# Patient Record
Sex: Female | Born: 1985 | Race: White | Hispanic: No | Marital: Married | Smoking: Current every day smoker
Health system: Southern US, Community
[De-identification: ages and names within clinical notes are randomized; demographics above are authoritative.]

## PROBLEM LIST (undated history)

## (undated) DIAGNOSIS — F32A Depression, unspecified: Secondary | ICD-10-CM

## (undated) DIAGNOSIS — Z789 Other specified health status: Secondary | ICD-10-CM

## (undated) DIAGNOSIS — F419 Anxiety disorder, unspecified: Secondary | ICD-10-CM

## (undated) DIAGNOSIS — F329 Major depressive disorder, single episode, unspecified: Secondary | ICD-10-CM

## (undated) HISTORY — PX: NO PAST SURGERIES: SHX2092

---
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
## 2006-03-26 ENCOUNTER — Ambulatory Visit (HOSPITAL_COMMUNITY): Admission: RE | Admit: 2006-03-26 | Discharge: 2006-03-26 | Payer: Self-pay | Admitting: Obstetrics & Gynecology

## 2006-04-20 IMAGING — US US OB FOLLOW-UP
1 series · 18 of 27 positions shown · non-contrast
Comparison: none

CLINICAL DATA: 39 week 5 day assigned gestational age.  Undergoing induction.  Evaluate fetal growth and estimated weight.  Large for gestational age.

[Series 1: us ob re-eval · 18 of 27 slices shown]
[im 1/27]
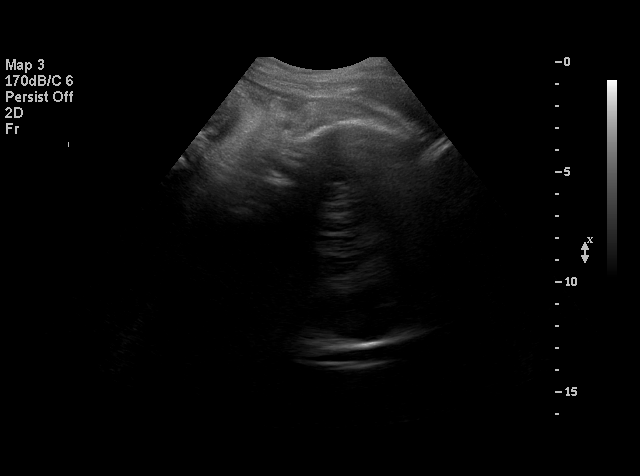
[im 3/27]
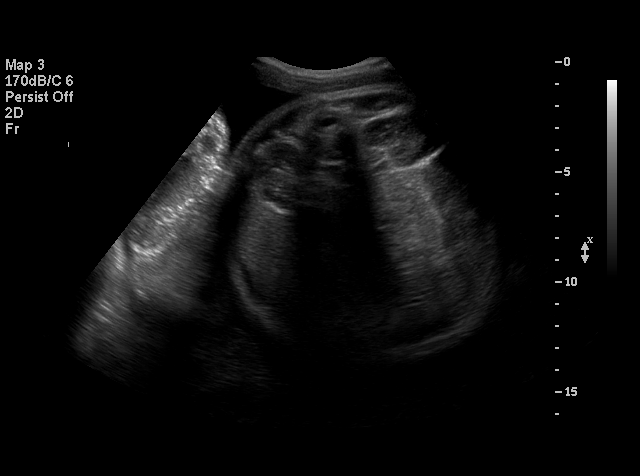
[im 4/27]
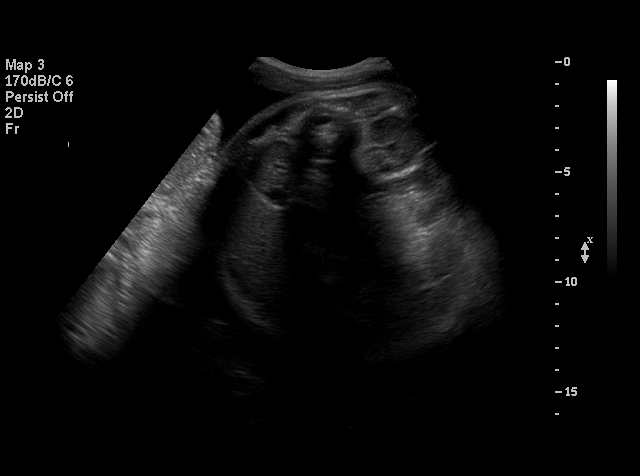
[im 6/27]
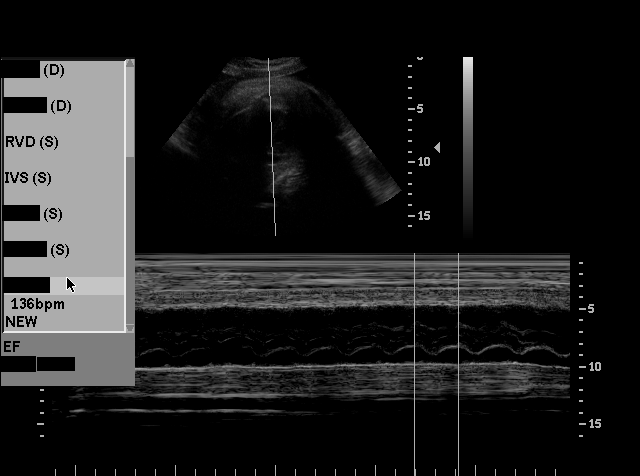
[im 7/27]
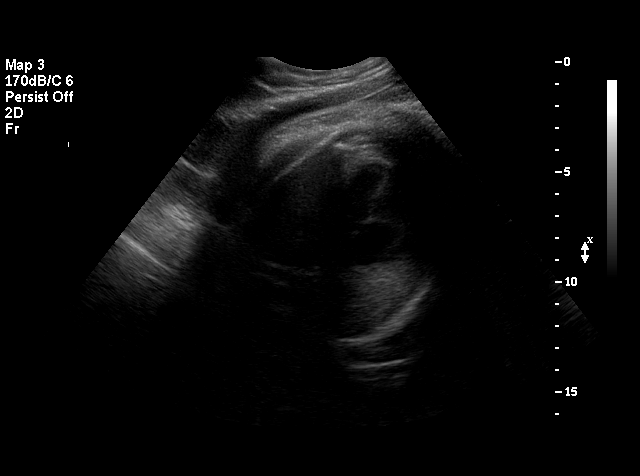
[im 9/27]
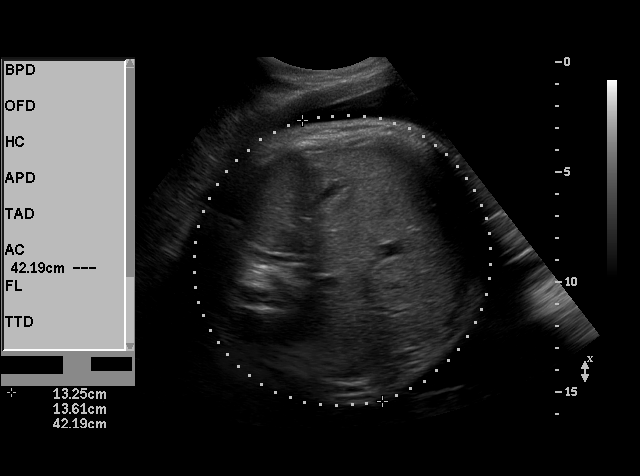
[im 10/27]
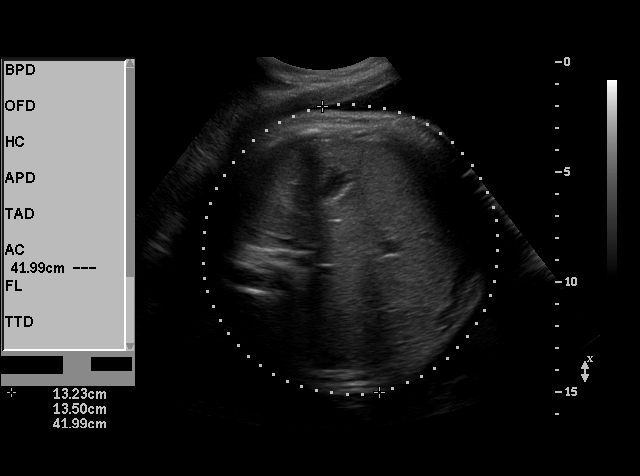
[im 12/27]
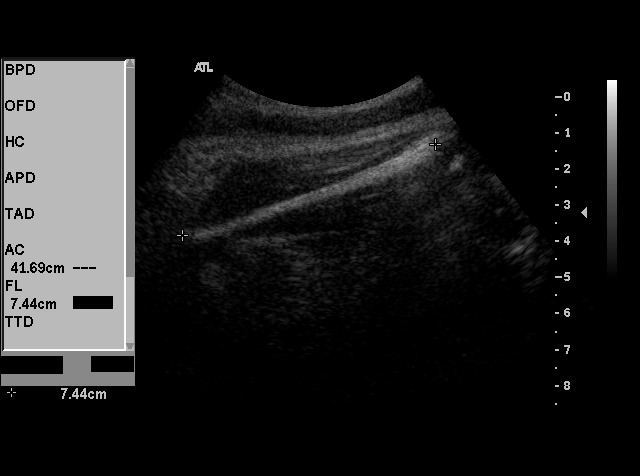
[im 13/27]
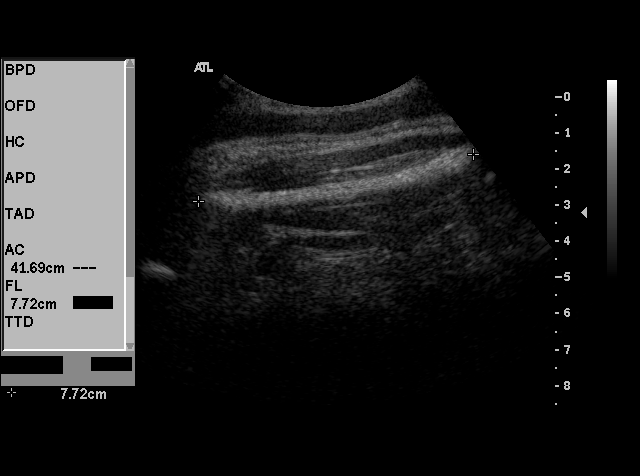
[im 15/27]
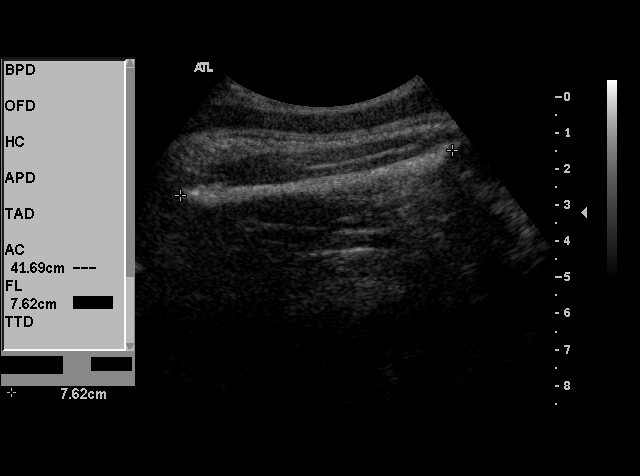
[im 16/27]
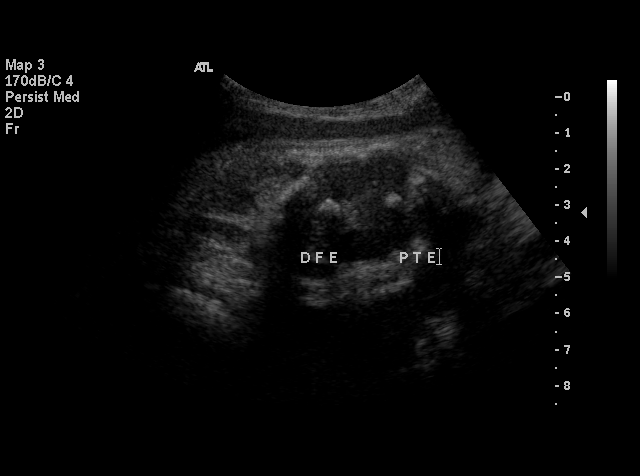
[im 18/27]
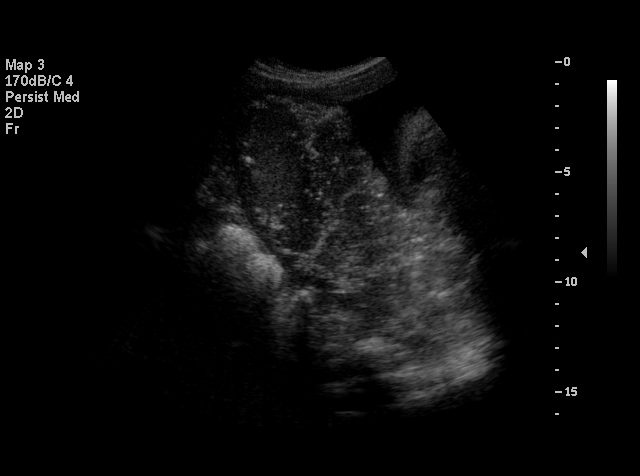
[im 19/27]
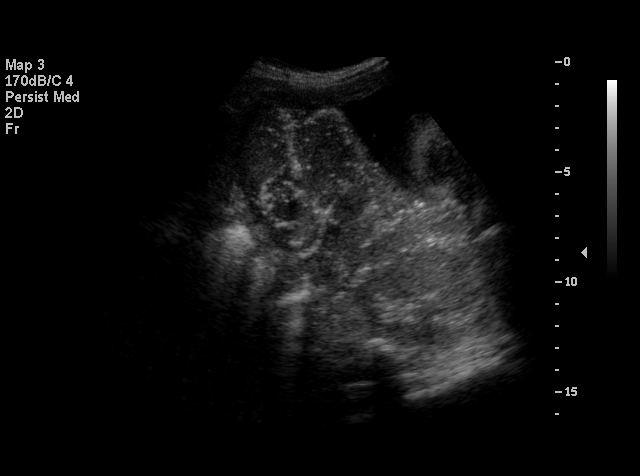
[im 21/27]
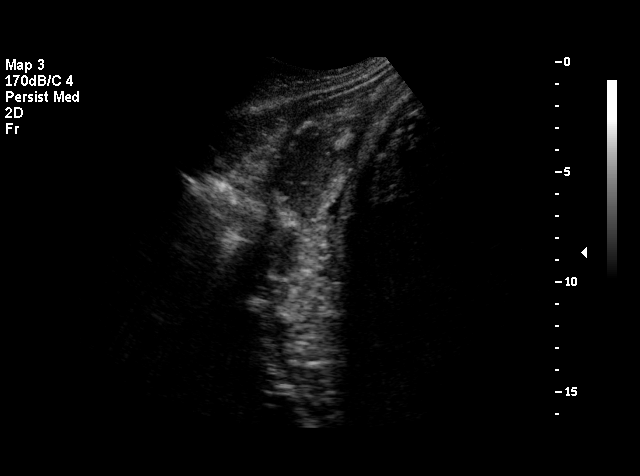
[im 22/27]
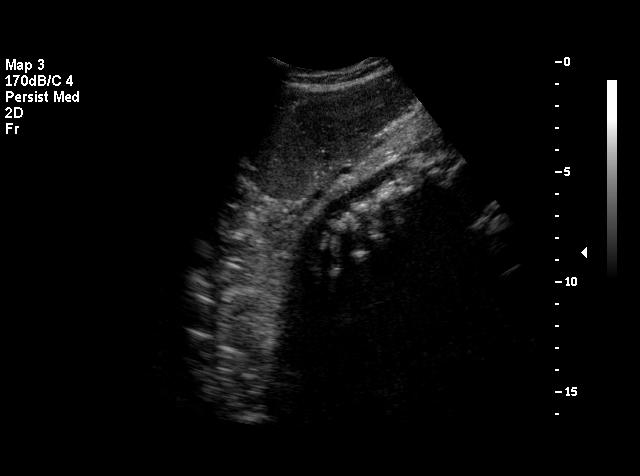
[im 24/27]
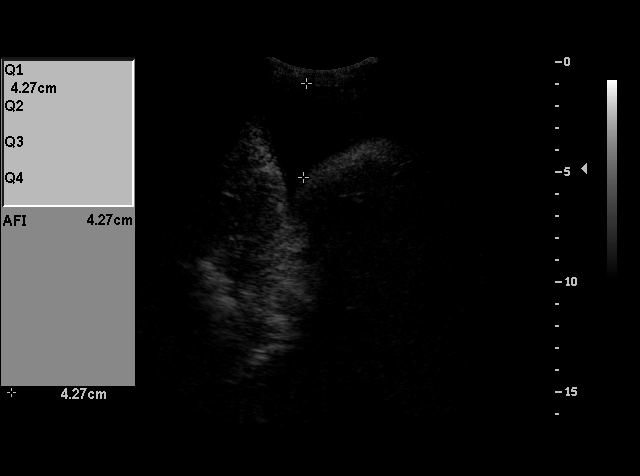
[im 25/27]
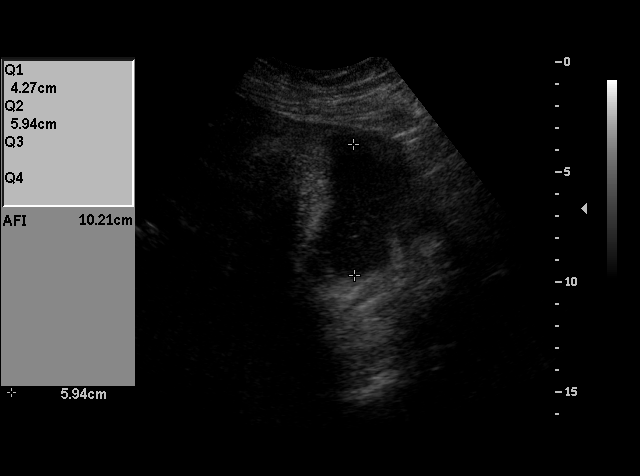
[im 27/27]
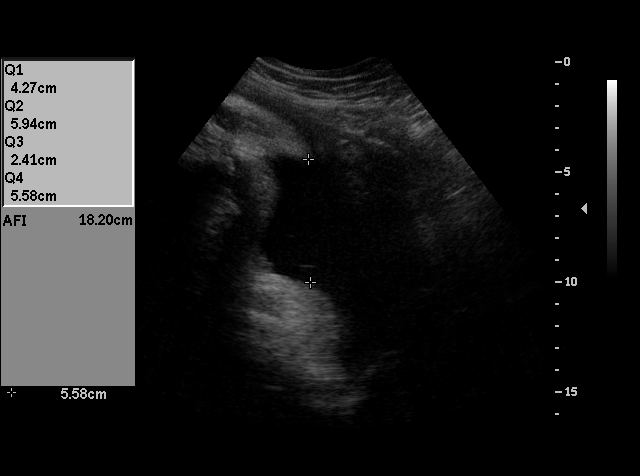

[18 of 27 positions shown; findings below may reference images not displayed]

OBSTETRICAL ULTRASOUND RE-EVALUATION:
Number of Fetuses: 1
Heart Rate:  136
Movement:  Yes
Breathing:  Yes
Presentation:  Cephalic
Placental Location:  Posterior
Grade:  III
Previa:  No
Amniotic Fluid (subjective):  Normal
Amniotic Fluid (objective):  18.2 cm AFI (5th -95th%ile = 7.1 ? 21.4 cm for 40 wks

FETAL BIOMETRY
BPD:    Unable to obtain due to low head position
HC:  Unable to obtain due to low head position
AC:  41.96 cm  42+ weeks
FL:  7.7 cm  39 w 3 d

Assigned GA:  39 w 5 d

Estimated fetal weight could not be calculated due to abdominal circumference being off the charts at greater than 42 weeks and inability to obtain head measurements due to fetal position.  

FETAL ANATOMY
Lateral Ventricles:  Previously seen 
Thalami/CSP:  Previously seen 
Posterior Fossa:  Previously seen 
Nuchal Region:  Previously seen 
Spine:  Previously seen 
4 Chamber Heart on Left:  Previously seen 
Stomach on Left:  Visualized 
3 Vessel Cord:  Previously seen  
Cord Insertion Site:  Previously seen 
Kidneys:  Visualized 
Bladder:  Visualized 
Extremities:  Previously seen 

MATERNAL UTERINE AND ADNEXAL FINDINGS
Cervix:  Not evaluated.
IMPRESSION: 1.  Assigned gestational age is currently 39 weeks 5 days.  Abdominal circumference is greater than two week ahead at >42 weeks (off the charts), and this is suspicious for fetal macrosomia.  
2.  Upper normal amniotic fluid volume with AFI of 18.2 cm.

## 2006-05-15 ENCOUNTER — Ambulatory Visit (HOSPITAL_COMMUNITY): Admission: RE | Admit: 2006-05-15 | Discharge: 2006-05-15 | Payer: Self-pay | Admitting: Obstetrics & Gynecology

## 2006-05-18 ENCOUNTER — Ambulatory Visit: Payer: Self-pay | Admitting: Gynecology

## 2006-07-15 ENCOUNTER — Emergency Department (HOSPITAL_COMMUNITY): Admission: EM | Admit: 2006-07-15 | Discharge: 2006-07-15 | Payer: Self-pay | Admitting: Emergency Medicine

## 2006-09-15 ENCOUNTER — Ambulatory Visit: Payer: Self-pay | Admitting: Obstetrics and Gynecology

## 2006-09-15 ENCOUNTER — Inpatient Hospital Stay (HOSPITAL_COMMUNITY): Admission: AD | Admit: 2006-09-15 | Discharge: 2006-09-15 | Payer: Self-pay | Admitting: Gynecology

## 2006-09-25 ENCOUNTER — Inpatient Hospital Stay (HOSPITAL_COMMUNITY): Admission: AD | Admit: 2006-09-25 | Discharge: 2006-09-25 | Payer: Self-pay | Admitting: Gynecology

## 2006-09-25 ENCOUNTER — Ambulatory Visit: Payer: Self-pay | Admitting: Obstetrics and Gynecology

## 2006-09-25 ENCOUNTER — Inpatient Hospital Stay (HOSPITAL_COMMUNITY): Admission: AD | Admit: 2006-09-25 | Discharge: 2006-09-26 | Payer: Self-pay | Admitting: Family Medicine

## 2006-10-08 ENCOUNTER — Inpatient Hospital Stay (HOSPITAL_COMMUNITY): Admission: AD | Admit: 2006-10-08 | Discharge: 2006-10-08 | Payer: Self-pay | Admitting: Obstetrics & Gynecology

## 2006-10-10 ENCOUNTER — Inpatient Hospital Stay (HOSPITAL_COMMUNITY): Admission: RE | Admit: 2006-10-10 | Discharge: 2006-10-12 | Payer: Self-pay | Admitting: Family Medicine

## 2006-10-10 ENCOUNTER — Ambulatory Visit: Payer: Self-pay | Admitting: Family Medicine

## 2007-08-24 IMAGING — US US OB COMP +14 WK
1 series · 13 of 28 positions shown · non-contrast
Comparison: none

CLINICAL DATA: Anatomy scan; assigned gestational age is 18 weeks 6 days.

[Series 1: us ob comp +14 wk · 0.37mm/px · 13 of 71 slices shown]
[im 3/71]
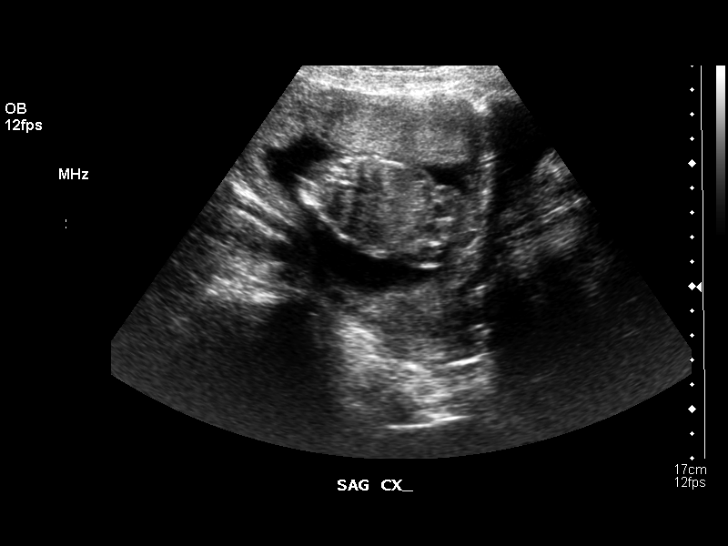
[im 8/71]
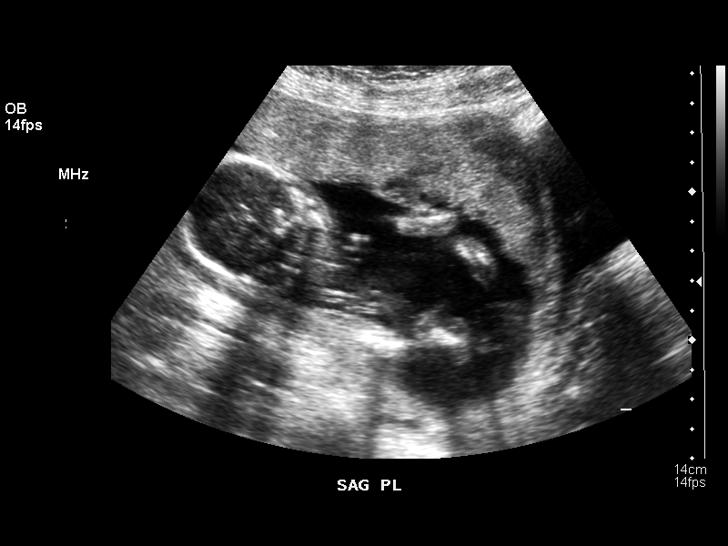
[im 13/71]
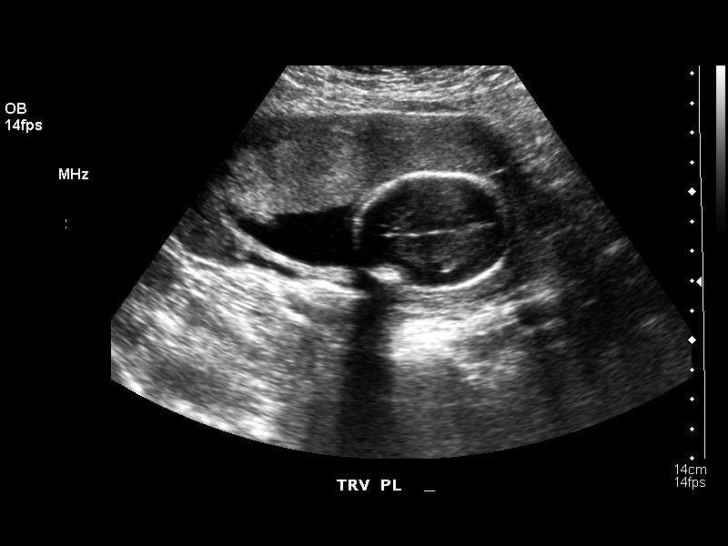
[im 19/71]
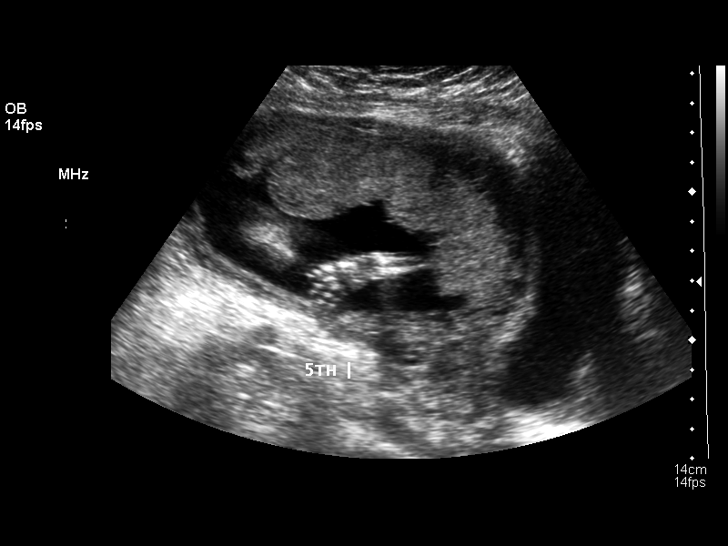
[im 24/71]
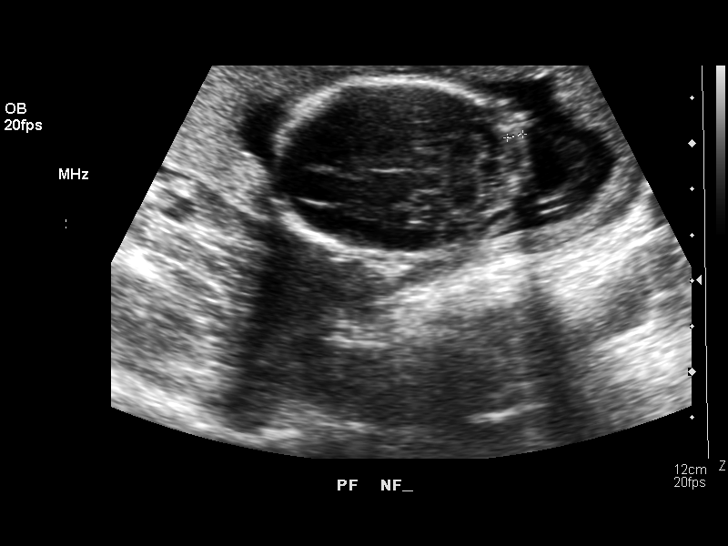
[im 29/71]
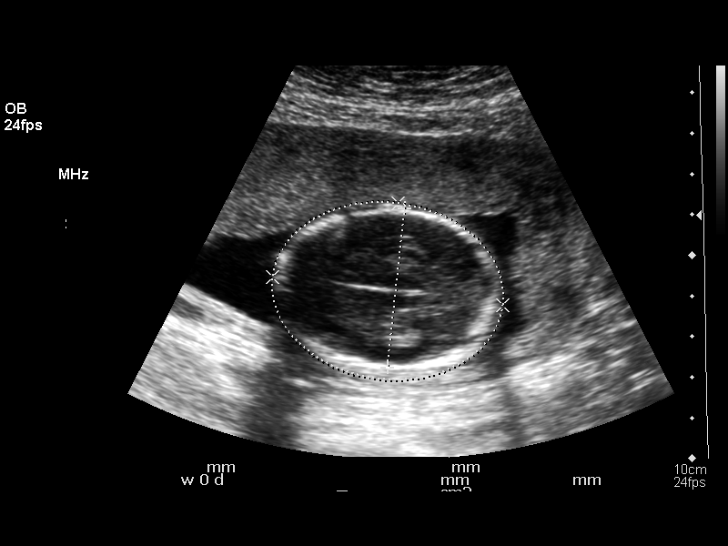
[im 37/71]
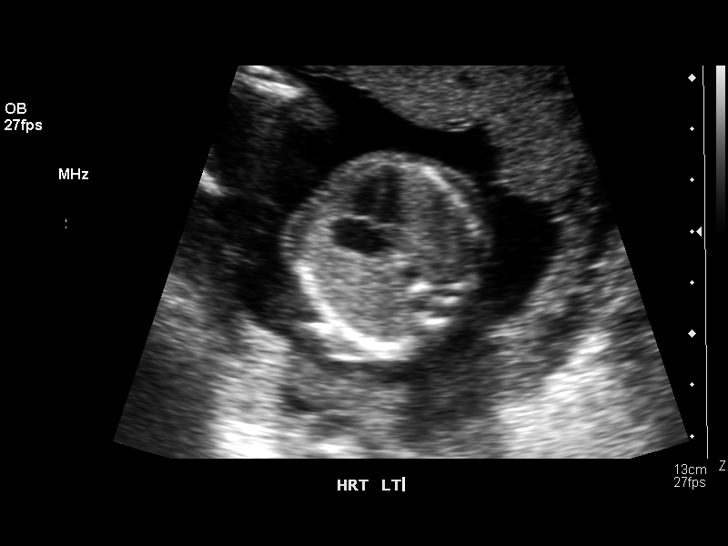
[im 42/71]
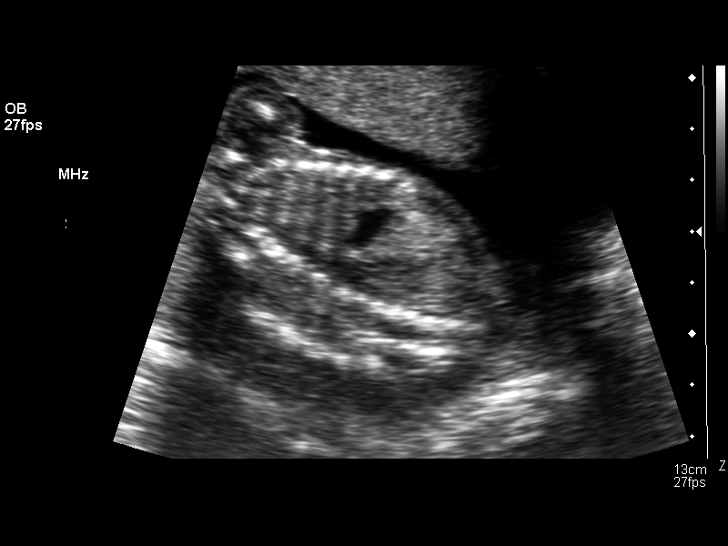
[im 47/71]
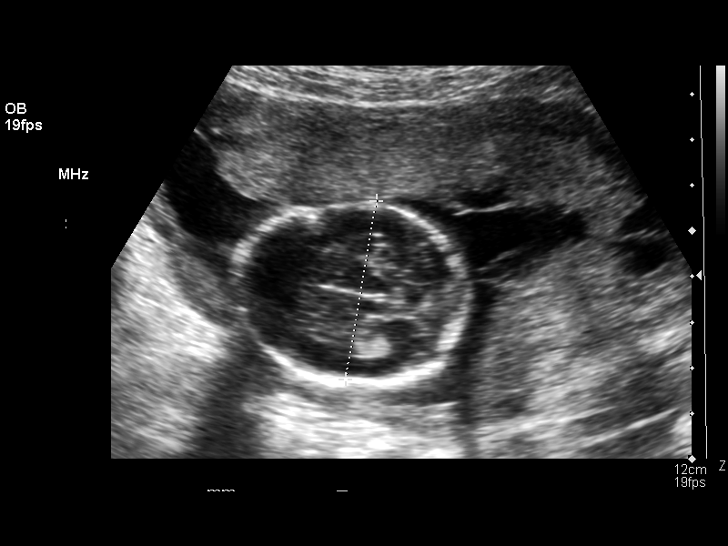
[im 52/71]
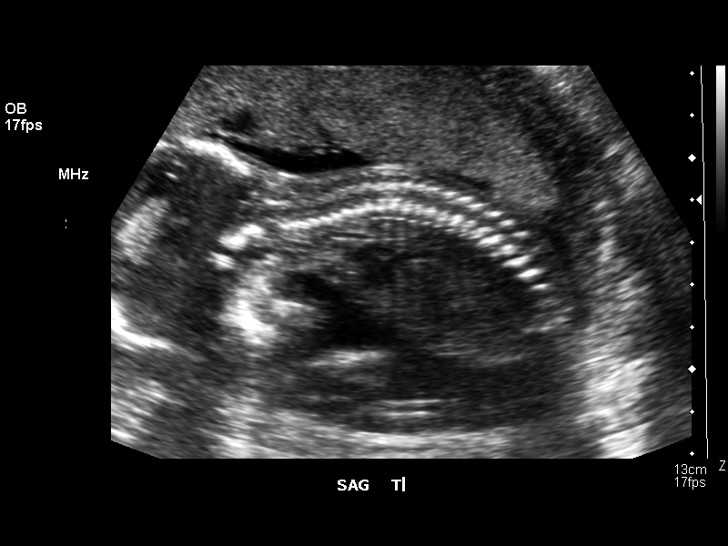
[im 58/71]
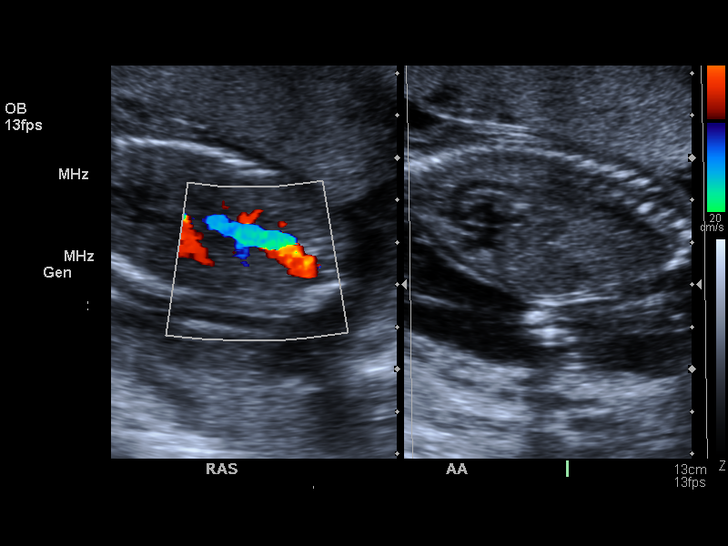
[im 63/71]
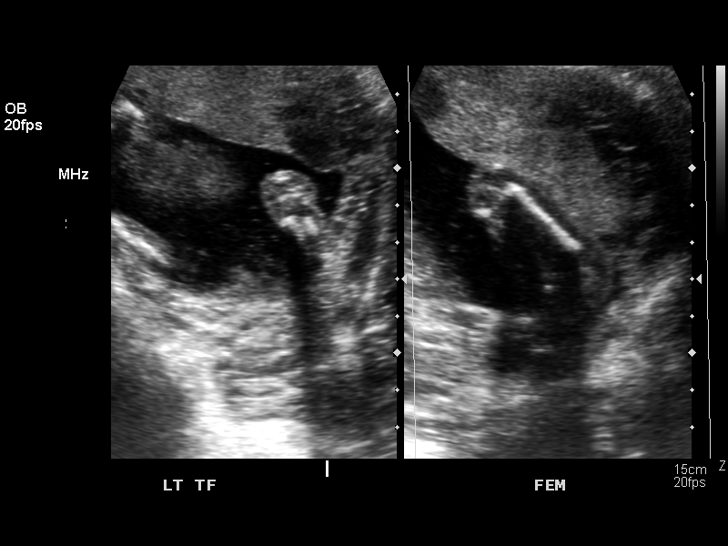
[im 68/71]
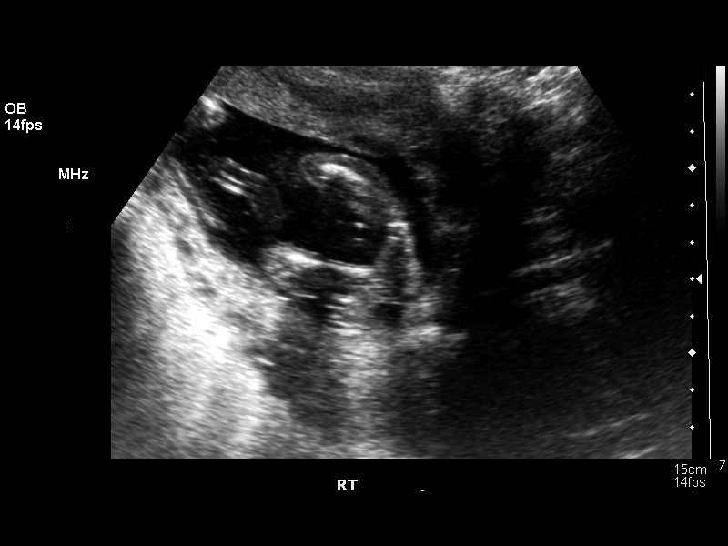

[13 of 28 positions shown; findings below may reference images not displayed]

OBSTETRICAL ULTRASOUND:
 Number of Fetuses:  1
 Heart Rate:  153 bpm
 Movement:  Yes
 Breathing:  No  
 Presentation:  Breech
 Placental Location:  Anterior
 Grade:  I
 Previa:  No
 Amniotic Fluid (Subjective):  Normal
 Amniotic Fluid (Objective):   4.0 cm vertical pocket 

 FETAL BIOMETRY
 BPD:   3.9 cm  18 w 0 d
 HC:   15.8 cm  18 w 5 d
 AC:  13.1 cm  18 w 4 d  
 FL:  2.8 cm  18 w 4 d

 MEAN GA:      18 w 3 d      US EDC:    10/13/06
 Assigned GA: 18 w 6 d   Assigned EDC:  10/10/06

 FETAL ANATOMY
 Lateral Ventricles:  Visualized  
 Thalami/CSP:  Visualized  
 Posterior Fossa:  Visualized   
 Nuchal Region:   MF=3.5 mm  Visualized 
 Spine:  Visualized  
 4 Chamber Heart on Left:  Visualized  
 Stomach on Left:  Visualized  
 3 Vessel Cord:  Visualized 
 Cord Insertion site:  Visualized 
 Kidneys:  Visualized   
 Bladder:  Visualized   
 Extremities:  Visualized  

 ADDITIONAL ANATOMY VISUALIZED:  LVOT, upper lip, orbits, profile, diaphragm, heel, 5th digit, and aortic arch.

 Evaluation limited by:  Fetal position.

 MATERNAL UTERINE AND ADNEXAL FINDINGS
 Cervix:   3.2 cm transabdominally.
IMPRESSION: There is a single living intrauterine gestation with a mean gestational age of 18 weeks 3 days, which is concordant with the assigned gestational age.  The fetal indices are concordant and the visualized anatomy is normal.

## 2010-11-21 ENCOUNTER — Other Ambulatory Visit: Payer: Self-pay | Admitting: Family Medicine

## 2010-11-21 DIAGNOSIS — Z3689 Encounter for other specified antenatal screening: Secondary | ICD-10-CM

## 2010-11-21 LAB — ABO/RH: RH Type: NEGATIVE

## 2010-11-21 LAB — HIV ANTIBODY (ROUTINE TESTING W REFLEX): HIV: NONREACTIVE

## 2010-11-21 LAB — RUBELLA ANTIBODY, IGM: Rubella: IMMUNE

## 2010-11-21 LAB — ANTIBODY SCREEN: Antibody Screen: NEGATIVE

## 2010-12-04 ENCOUNTER — Inpatient Hospital Stay (HOSPITAL_COMMUNITY)
Admission: AD | Admit: 2010-12-04 | Discharge: 2010-12-04 | Disposition: A | Discharge status: Home / Self Care | Payer: Medicaid Other | Source: Ambulatory Visit | Attending: Obstetrics & Gynecology | Admitting: Obstetrics & Gynecology

## 2010-12-04 DIAGNOSIS — J069 Acute upper respiratory infection, unspecified: Secondary | ICD-10-CM | POA: Insufficient documentation

## 2010-12-04 DIAGNOSIS — O9989 Other specified diseases and conditions complicating pregnancy, childbirth and the puerperium: Secondary | ICD-10-CM

## 2010-12-04 DIAGNOSIS — O99891 Other specified diseases and conditions complicating pregnancy: Secondary | ICD-10-CM | POA: Insufficient documentation

## 2011-01-06 ENCOUNTER — Ambulatory Visit (HOSPITAL_COMMUNITY)
Admission: RE | Admit: 2011-01-06 | Discharge: 2011-01-06 | Disposition: A | Discharge status: Home / Self Care | Payer: Medicaid Other | Source: Ambulatory Visit | Attending: Family Medicine | Admitting: Family Medicine

## 2011-01-06 ENCOUNTER — Other Ambulatory Visit: Payer: Self-pay | Admitting: Family Medicine

## 2011-01-06 DIAGNOSIS — Z3689 Encounter for other specified antenatal screening: Secondary | ICD-10-CM

## 2011-01-06 DIAGNOSIS — Z363 Encounter for antenatal screening for malformations: Secondary | ICD-10-CM | POA: Insufficient documentation

## 2011-01-06 DIAGNOSIS — O358XX0 Maternal care for other (suspected) fetal abnormality and damage, not applicable or unspecified: Secondary | ICD-10-CM | POA: Insufficient documentation

## 2011-01-06 DIAGNOSIS — Z1389 Encounter for screening for other disorder: Secondary | ICD-10-CM | POA: Insufficient documentation

## 2011-04-17 ENCOUNTER — Other Ambulatory Visit: Payer: Self-pay | Admitting: Family Medicine

## 2011-04-17 DIAGNOSIS — Z1389 Encounter for screening for other disorder: Secondary | ICD-10-CM

## 2011-04-19 ENCOUNTER — Ambulatory Visit (HOSPITAL_COMMUNITY)
Admission: RE | Admit: 2011-04-19 | Discharge: 2011-04-19 | Disposition: A | Discharge status: Home / Self Care | Payer: Medicaid Other | Source: Ambulatory Visit | Attending: Family Medicine | Admitting: Family Medicine

## 2011-04-19 DIAGNOSIS — Z1389 Encounter for screening for other disorder: Secondary | ICD-10-CM

## 2011-04-19 DIAGNOSIS — O3660X0 Maternal care for excessive fetal growth, unspecified trimester, not applicable or unspecified: Secondary | ICD-10-CM | POA: Insufficient documentation

## 2011-04-19 DIAGNOSIS — Z3689 Encounter for other specified antenatal screening: Secondary | ICD-10-CM | POA: Insufficient documentation

## 2011-05-01 LAB — STREP B DNA PROBE: GBS: NEGATIVE

## 2011-05-13 ENCOUNTER — Inpatient Hospital Stay (HOSPITAL_COMMUNITY): Payer: Medicaid Other | Admitting: Anesthesiology

## 2011-05-13 ENCOUNTER — Encounter (HOSPITAL_COMMUNITY): Payer: Self-pay | Admitting: Anesthesiology

## 2011-05-13 ENCOUNTER — Encounter (HOSPITAL_COMMUNITY): Payer: Self-pay

## 2011-05-13 ENCOUNTER — Encounter (HOSPITAL_COMMUNITY)
Admission: AD | Disposition: A | Discharge status: Home / Self Care | Payer: Self-pay | Source: Ambulatory Visit | Attending: Obstetrics & Gynecology

## 2011-05-13 ENCOUNTER — Encounter (HOSPITAL_COMMUNITY): Payer: Self-pay | Admitting: *Deleted

## 2011-05-13 ENCOUNTER — Inpatient Hospital Stay (HOSPITAL_COMMUNITY)
Admission: AD | Admit: 2011-05-13 | Discharge: 2011-05-15 | DRG: 766 | Disposition: A | Discharge status: Home / Self Care | Payer: Medicaid Other | Source: Ambulatory Visit | Attending: Obstetrics & Gynecology | Admitting: Obstetrics & Gynecology

## 2011-05-13 DIAGNOSIS — O34219 Maternal care for unspecified type scar from previous cesarean delivery: Secondary | ICD-10-CM

## 2011-05-13 DIAGNOSIS — Z302 Encounter for sterilization: Secondary | ICD-10-CM

## 2011-05-13 DIAGNOSIS — O9903 Anemia complicating the puerperium: Secondary | ICD-10-CM | POA: Diagnosis not present

## 2011-05-13 DIAGNOSIS — Z98891 History of uterine scar from previous surgery: Secondary | ICD-10-CM

## 2011-05-13 DIAGNOSIS — O4290 Premature rupture of membranes, unspecified as to length of time between rupture and onset of labor, unspecified weeks of gestation: Secondary | ICD-10-CM

## 2011-05-13 DIAGNOSIS — D649 Anemia, unspecified: Secondary | ICD-10-CM | POA: Diagnosis not present

## 2011-05-13 HISTORY — DX: Other specified health status: Z78.9

## 2011-05-13 LAB — SURGICAL PCR SCREEN
MRSA, PCR: NEGATIVE
Staphylococcus aureus: NEGATIVE

## 2011-05-13 LAB — CBC
HCT: 33 % — ABNORMAL LOW (ref 36.0–46.0)
MCHC: 34.2 g/dL (ref 30.0–36.0)
Platelets: 208 10*3/uL (ref 150–400)
RDW: 12.9 % (ref 11.5–15.5)
WBC: 15.1 10*3/uL — ABNORMAL HIGH (ref 4.0–10.5)

## 2011-05-13 LAB — POCT FERN TEST: Fern Test: POSITIVE

## 2011-05-13 SURGERY — Surgical Case
Anesthesia: Regional | Site: Abdomen | Wound class: Clean Contaminated

## 2011-05-13 SURGERY — Surgical Case
Anesthesia: Regional

## 2011-05-13 MED ORDER — SCOPOLAMINE 1 MG/3DAYS TD PT72
MEDICATED_PATCH | TRANSDERMAL | Status: AC
  Administered 2011-05-13: 1.5 mg
  Filled 2011-05-13: qty 1

## 2011-05-13 MED ORDER — FENTANYL CITRATE 0.05 MG/ML IJ SOLN
INTRAMUSCULAR | Status: DC | PRN
  Administered 2011-05-13: 25 ug via INTRATHECAL
  Administered 2011-05-13: 75 ug via INTRAVENOUS

## 2011-05-13 MED ORDER — LACTATED RINGERS IV SOLN
INTRAVENOUS | Status: DC
  Administered 2011-05-13 (×3): via INTRAVENOUS

## 2011-05-13 MED ORDER — FENTANYL CITRATE 0.05 MG/ML IJ SOLN
INTRAMUSCULAR | Status: AC
  Filled 2011-05-13: qty 2

## 2011-05-13 MED ORDER — PHENYLEPHRINE 40 MCG/ML (10ML) SYRINGE FOR IV PUSH (FOR BLOOD PRESSURE SUPPORT)
PREFILLED_SYRINGE | INTRAVENOUS | Status: AC
  Filled 2011-05-13: qty 10

## 2011-05-13 MED ORDER — OXYTOCIN 20 UNITS IN LACTATED RINGERS INFUSION - SIMPLE: 125.0000 mL/h | INTRAVENOUS | Status: AC

## 2011-05-13 MED ORDER — SCOPOLAMINE 1 MG/3DAYS TD PT72
1.0000 | MEDICATED_PATCH | Freq: Once | TRANSDERMAL | Status: DC
  Filled 2011-05-13: qty 1

## 2011-05-13 MED ORDER — CEFAZOLIN SODIUM 1-5 GM-% IV SOLN
INTRAVENOUS | Status: DC | PRN
  Administered 2011-05-13: 2 g via INTRAVENOUS

## 2011-05-13 MED ORDER — SODIUM CHLORIDE 0.9 % IR SOLN
Status: DC | PRN
  Administered 2011-05-13: 1000 mL

## 2011-05-13 MED ORDER — PHENYLEPHRINE HCL 10 MG/ML IJ SOLN
INTRAMUSCULAR | Status: DC | PRN
  Administered 2011-05-13 (×2): 40 ug via INTRAVENOUS
  Administered 2011-05-13: 120 ug via INTRAVENOUS

## 2011-05-13 MED ORDER — IBUPROFEN 600 MG PO TABS
600.0000 mg | ORAL_TABLET | Freq: Four times a day (QID) | ORAL | Status: DC
  Administered 2011-05-14 – 2011-05-15 (×5): 600 mg via ORAL
  Filled 2011-05-13 (×5): qty 1

## 2011-05-13 MED ORDER — MEPERIDINE HCL 25 MG/ML IJ SOLN: 6.2500 mg | INTRAMUSCULAR | Status: DC | PRN

## 2011-05-13 MED ORDER — OXYCODONE-ACETAMINOPHEN 5-325 MG PO TABS
1.0000 | ORAL_TABLET | ORAL | Status: DC | PRN
  Administered 2011-05-14 – 2011-05-15 (×3): 2 via ORAL
  Filled 2011-05-13 (×3): qty 2

## 2011-05-13 MED ORDER — ONDANSETRON HCL 4 MG/2ML IJ SOLN
INTRAMUSCULAR | Status: DC | PRN
  Administered 2011-05-13: 4 mg via INTRAVENOUS

## 2011-05-13 MED ORDER — EPHEDRINE 5 MG/ML INJ
INTRAVENOUS | Status: AC
  Filled 2011-05-13: qty 10

## 2011-05-13 MED ORDER — SENNOSIDES-DOCUSATE SODIUM 8.6-50 MG PO TABS
2.0000 | ORAL_TABLET | Freq: Every day | ORAL | Status: DC
  Administered 2011-05-14: 2 via ORAL

## 2011-05-13 MED ORDER — MEPERIDINE HCL 25 MG/ML IJ SOLN
INTRAMUSCULAR | Status: AC
  Administered 2011-05-13: 12.5 mg via INTRAVENOUS
  Filled 2011-05-13: qty 1

## 2011-05-13 MED ORDER — MENTHOL 3 MG MT LOZG: 1.0000 | LOZENGE | OROMUCOSAL | Status: DC | PRN

## 2011-05-13 MED ORDER — OXYTOCIN 10 UNIT/ML IJ SOLN
INTRAMUSCULAR | Status: AC
  Filled 2011-05-13: qty 4

## 2011-05-13 MED ORDER — KETOROLAC TROMETHAMINE 60 MG/2ML IM SOLN
60.0000 mg | Freq: Once | INTRAMUSCULAR | Status: AC | PRN
  Filled 2011-05-13: qty 2

## 2011-05-13 MED ORDER — PRENATAL PLUS 27-1 MG PO TABS
1.0000 | ORAL_TABLET | Freq: Every day | ORAL | Status: DC
  Administered 2011-05-14 – 2011-05-15 (×2): 1 via ORAL
  Filled 2011-05-13 (×2): qty 1

## 2011-05-13 MED ORDER — LANOLIN HYDROUS EX OINT: 1.0000 "application " | TOPICAL_OINTMENT | CUTANEOUS | Status: DC | PRN

## 2011-05-13 MED ORDER — WITCH HAZEL-GLYCERIN EX PADS: 1.0000 "application " | MEDICATED_PAD | CUTANEOUS | Status: DC | PRN

## 2011-05-13 MED ORDER — IBUPROFEN 600 MG PO TABS: 600.0000 mg | ORAL_TABLET | Freq: Four times a day (QID) | ORAL | Status: DC

## 2011-05-13 MED ORDER — DIPHENHYDRAMINE HCL 25 MG PO CAPS
25.0000 mg | ORAL_CAPSULE | Freq: Four times a day (QID) | ORAL | Status: DC | PRN
  Administered 2011-05-13: 25 mg via ORAL
  Filled 2011-05-13: qty 1

## 2011-05-13 MED ORDER — SIMETHICONE 80 MG PO CHEW
80.0000 mg | CHEWABLE_TABLET | Freq: Three times a day (TID) | ORAL | Status: DC
  Administered 2011-05-14 – 2011-05-15 (×5): 80 mg via ORAL

## 2011-05-13 MED ORDER — NALBUPHINE SYRINGE 5 MG/0.5 ML
10.0000 mg | INJECTION | INTRAMUSCULAR | Status: AC
  Administered 2011-05-13: 10 mg via INTRAVENOUS
  Filled 2011-05-13: qty 0.5

## 2011-05-13 MED ORDER — CEFAZOLIN SODIUM-DEXTROSE 2-3 GM-% IV SOLR: 2.0000 g | INTRAVENOUS | Status: DC

## 2011-05-13 MED ORDER — IBUPROFEN 600 MG PO TABS: 600.0000 mg | ORAL_TABLET | Freq: Four times a day (QID) | ORAL | Status: DC | PRN

## 2011-05-13 MED ORDER — MORPHINE SULFATE 0.5 MG/ML IJ SOLN
INTRAMUSCULAR | Status: AC
Start: 2011-05-13 — End: 2011-05-13
  Filled 2011-05-13: qty 10

## 2011-05-13 MED ORDER — MORPHINE SULFATE (PF) 0.5 MG/ML IJ SOLN
INTRAMUSCULAR | Status: DC | PRN
  Administered 2011-05-13: .15 mg via INTRATHECAL

## 2011-05-13 MED ORDER — SIMETHICONE 80 MG PO CHEW: 80.0000 mg | CHEWABLE_TABLET | ORAL | Status: DC | PRN

## 2011-05-13 MED ORDER — EPHEDRINE SULFATE 50 MG/ML IJ SOLN
INTRAMUSCULAR | Status: DC | PRN
  Administered 2011-05-13: 5 mg via INTRAVENOUS
  Administered 2011-05-13 (×3): 10 mg via INTRAVENOUS
  Administered 2011-05-13: 5 mg via INTRAVENOUS

## 2011-05-13 MED ORDER — CITRIC ACID-SODIUM CITRATE 334-500 MG/5ML PO SOLN
ORAL | Status: AC
  Administered 2011-05-13: 30 mL via ORAL
  Filled 2011-05-13: qty 15

## 2011-05-13 MED ORDER — ZOLPIDEM TARTRATE 5 MG PO TABS: 5.0000 mg | ORAL_TABLET | Freq: Every evening | ORAL | Status: DC | PRN

## 2011-05-13 MED ORDER — MORPHINE SULFATE (PF) 0.5 MG/ML IJ SOLN
INTRAMUSCULAR | Status: DC | PRN
  Administered 2011-05-13 (×2): 1 mg via INTRAVENOUS
  Administered 2011-05-13: .85 mg via INTRAVENOUS
  Administered 2011-05-13: 1 mg via EPIDURAL
  Administered 2011-05-13: 1 mg via INTRAVENOUS

## 2011-05-13 MED ORDER — DIBUCAINE 1 % RE OINT: 1.0000 "application " | TOPICAL_OINTMENT | RECTAL | Status: DC | PRN

## 2011-05-13 MED ORDER — TETANUS-DIPHTH-ACELL PERTUSSIS 5-2.5-18.5 LF-MCG/0.5 IM SUSP
0.5000 mL | Freq: Once | INTRAMUSCULAR | Status: AC
  Administered 2011-05-14: 0.5 mL via INTRAMUSCULAR
  Filled 2011-05-13: qty 0.5

## 2011-05-13 MED ORDER — ONDANSETRON HCL 4 MG/2ML IJ SOLN
INTRAMUSCULAR | Status: AC
  Filled 2011-05-13: qty 2

## 2011-05-13 MED ORDER — ONDANSETRON HCL 4 MG/2ML IJ SOLN: 4.0000 mg | INTRAMUSCULAR | Status: DC | PRN

## 2011-05-13 MED ORDER — OXYTOCIN 10 UNIT/ML IJ SOLN
20.0000 [IU] | INTRAVENOUS | Status: DC | PRN
  Administered 2011-05-13: 20 [IU] via INTRAVENOUS

## 2011-05-13 MED ORDER — BUPIVACAINE IN DEXTROSE 0.75-8.25 % IT SOLN
INTRATHECAL | Status: DC | PRN
  Administered 2011-05-13: 1.7 mL via INTRATHECAL

## 2011-05-13 MED ORDER — ONDANSETRON HCL 4 MG PO TABS: 4.0000 mg | ORAL_TABLET | ORAL | Status: DC | PRN

## 2011-05-13 MED ORDER — LACTATED RINGERS IV SOLN: INTRAVENOUS | Status: DC

## 2011-05-13 MED ORDER — CEFAZOLIN SODIUM 1-5 GM-% IV SOLN
INTRAVENOUS | Status: AC
  Filled 2011-05-13: qty 50

## 2011-05-13 SURGICAL SUPPLY — 28 items
CHLORAPREP W/TINT 26ML (MISCELLANEOUS) ×2 IMPLANT
CLIP FILSHIE TUBAL LIGA STRL (Clip) ×1 IMPLANT
CLOTH BEACON ORANGE TIMEOUT ST (SAFETY) ×2 IMPLANT
DRSG COVADERM 4X10 (GAUZE/BANDAGES/DRESSINGS) ×1 IMPLANT
DRSG PAD ABDOMINAL 8X10 ST (GAUZE/BANDAGES/DRESSINGS) ×1 IMPLANT
ELECT REM PT RETURN 9FT ADLT (ELECTROSURGICAL) ×2
ELECTRODE REM PT RTRN 9FT ADLT (ELECTROSURGICAL) ×1 IMPLANT
EXTRACTOR VACUUM KIWI (MISCELLANEOUS) IMPLANT
FILSHI CLIP ×1 IMPLANT
GLOVE BIO SURGEON STRL SZ 6.5 (GLOVE) ×2 IMPLANT
GLOVE BIOGEL PI IND STRL 7.0 (GLOVE) ×2 IMPLANT
GLOVE BIOGEL PI INDICATOR 7.0 (GLOVE) ×3
GOWN PREVENTION PLUS LG XLONG (DISPOSABLE) ×6 IMPLANT
KIT ABG SYR 3ML LUER SLIP (SYRINGE) IMPLANT
NDL HYPO 25X5/8 SAFETYGLIDE (NEEDLE) IMPLANT
NEEDLE HYPO 25X5/8 SAFETYGLIDE (NEEDLE) IMPLANT
NS IRRIG 1000ML POUR BTL (IV SOLUTION) ×2 IMPLANT
PACK C SECTION WH (CUSTOM PROCEDURE TRAY) ×2 IMPLANT
SLEEVE SCD COMPRESS KNEE LRG (MISCELLANEOUS) IMPLANT
SLEEVE SCD COMPRESS KNEE MED (MISCELLANEOUS) ×1 IMPLANT
SUT VIC AB 0 CT1 36 (SUTURE) ×11 IMPLANT
SUT VIC AB 2-0 CT1 27 (SUTURE) ×2
SUT VIC AB 2-0 CT1 TAPERPNT 27 (SUTURE) ×1 IMPLANT
SUT VIC AB 4-0 PS2 27 (SUTURE) ×2 IMPLANT
TAPE CLOTH SURG 4X10 WHT LF (GAUZE/BANDAGES/DRESSINGS) ×1 IMPLANT
TOWEL OR 17X24 6PK STRL BLUE (TOWEL DISPOSABLE) ×3 IMPLANT
TRAY FOLEY CATH 14FR (SET/KITS/TRAYS/PACK) ×1 IMPLANT
WATER STERILE IRR 1000ML POUR (IV SOLUTION) ×2 IMPLANT

## 2011-05-13 NOTE — Transfer of Care (Signed)
Immediate Anesthesia Transfer of Care Note  Patient: Joanna Todd  Procedure(s) Performed:  CESAREAN SECTION WITH BILATERAL TUBAL LIGATION - Repeat cesarean section with delivery of baby boy  at 1934 APGAR 9/9  Bilateral tubal ligation with filshie clips.  Patient Location: PACU  Anesthesia Type: Spinal  Level of Consciousness: awake, alert  and oriented  Airway & Oxygen Therapy: Patient Spontanous Breathing  Post-op Assessment: Report given to PACU RN and Post -op Vital signs reviewed and stable  Post vital signs: stable  Complications: No apparent anesthesia complications

## 2011-05-13 NOTE — Anesthesia Procedure Notes (Signed)
Spinal Block  Patient location during procedure: OR Start time: 05/13/2011 7:08 PM Staffing Anesthesiologist: FOSTER, MICHAEL A. Performed by: anesthesiologist  Preanesthetic Checklist Completed: patient identified, site marked, surgical consent, pre-op evaluation, timeout performed, IV checked, risks and benefits discussed and monitors and equipment checked Spinal Block Patient position: sitting Prep: DuraPrep Patient monitoring: heart rate, cardiac monitor, continuous pulse ox and blood pressure Approach: midline Location: L3-4 Injection technique: single-shot Needle Needle type: Sprotte  Needle gauge: 24 G Needle length: 9 cm Needle insertion depth: 5 cm Assessment Sensory level: T4 Events: paresthesia Additional Notes Transient Paresthesia left leg. Patient tolerated procedure well.

## 2011-05-13 NOTE — Anesthesia Postprocedure Evaluation (Signed)
  Anesthesia Post-op Note  Patient: Joanna Todd  Procedure(s) Performed:  CESAREAN SECTION WITH BILATERAL TUBAL LIGATION - Repeat cesarean section with delivery of baby boy  at 1934 APGAR 9/9  Bilateral tubal ligation with filshie clips.  Patient Location: PACU  Anesthesia Type: Spinal  Level of Consciousness: awake, alert  and oriented  Airway and Oxygen Therapy: Patient Spontanous Breathing  Post-op Pain: none  Post-op Assessment: Post-op Vital signs reviewed, Patient's Cardiovascular Status Stable, Respiratory Function Stable, Patent Airway, No signs of Nausea or vomiting, Pain level controlled, No headache and No backache. Sensory and Motor block resolving.  Post-op Vital Signs: Reviewed and stable  Complications: No apparent anesthesia complications

## 2011-05-13 NOTE — Anesthesia Preprocedure Evaluation (Addendum)
Anesthesia Evaluation  Name, MR# and DOB Patient awake  General Assessment Comment  Reviewed: Allergy & Precautions, H&P , Patient's Chart, lab work & pertinent test results  History of Anesthesia Complications (+) PONV  Airway Mallampati: II TM Distance: >3 FB Neck ROM: Full    Dental No notable dental hx. (+) Teeth Intact Orthodontic Appliances:   Pulmonary  clear to auscultation  pulmonary exam normalPulmonary Exam Normal breath sounds clear to auscultation none    Cardiovascular Regular Normal    Neuro/Psych Negative Neurological ROS  Negative Psych ROS  GI/Hepatic/Renal negative GI ROS  negative Liver ROS  negative Renal ROS        Endo/Other  Negative Endocrine ROS (+)      Abdominal   Musculoskeletal   Hematology negative hematology ROS (+)   Peds  Reproductive/Obstetrics (+) Pregnancy    Anesthesia Other Findings             Anesthesia Physical Anesthesia Plan  ASA: II and Emergent  Anesthesia Plan: Spinal   Post-op Pain Management:    Induction:   Airway Management Planned:   Additional Equipment:   Intra-op Plan:   Post-operative Plan:   Informed Consent: I have reviewed the patients History and Physical, chart, labs and discussed the procedure including the risks, benefits and alternatives for the proposed anesthesia with the patient or authorized representative who has indicated his/her understanding and acceptance.     Plan Discussed with: Anesthesiologist  Anesthesia Plan Comments:        Anesthesia Quick Evaluation

## 2011-05-13 NOTE — Progress Notes (Signed)
Report & pt care given to Thermon Leyland, RN

## 2011-05-13 NOTE — OR Nursing (Signed)
Fundal massage DLWegner RN 

## 2011-05-13 NOTE — H&P (Signed)
Joanna Todd is a 25 y.o. female presenting for SROM. Maternal Medical History:  Reason for admission: Reason for admission: rupture of membranes and contractions.  Reason for Admission:   nauseaContractions: Onset was 1-2 hours ago.   Frequency: irregular.   Perceived severity is mild.    Fetal activity: Perceived fetal activity is normal.   Last perceived fetal movement was within the past hour.     History of C/S x 2  OB History    Grav Para Term Preterm Abortions TAB SAB Ect Mult Living   3 2 2  0 0 0 0 0 0 2     Past Medical History  Diagnosis Date  . No pertinent past medical history    Past Surgical History  Procedure Date  . Cesarean section    Family History: family history is not on file. Social History:  reports that she has been smoking.  She does not have any smokeless tobacco history on file. She reports that she does not drink alcohol or use illicit drugs.  Review of Systems  Constitutional: Negative.   Respiratory: Negative.   Cardiovascular: Negative.   Gastrointestinal: Negative for nausea, vomiting, abdominal pain, diarrhea and constipation.  Genitourinary: Negative for dysuria, urgency, frequency, hematuria and flank pain.       Negative for vaginal bleeding, Positive for contractions and leaking fluid  Musculoskeletal: Negative.   Neurological: Negative.   Psychiatric/Behavioral: Negative.     Dilation: 1.5 Effacement (%): Thick Station: -2 Exam by:: Telford Nab CNM  Blood pressure 116/66, pulse 78, temperature 98.3 F (36.8 C), temperature source Oral, resp. rate 16, height 5\' 4"  (1.626 m), weight 89.177 kg (196 lb 9.6 oz). Maternal Exam:  Uterine Assessment: Contraction strength is mild.  Contraction frequency is irregular.   Abdomen: Patient reports no abdominal tenderness. Surgical scars: low transverse.   Fetal presentation: vertex  Introitus: Normal vulva. Vagina is positive for vaginal discharge.  Ferning test: positive.  Amniotic  fluid character: clear.  Cervix: Cervix evaluated by sterile speculum exam and digital exam.     Fetal Exam Fetal Monitor Review: Baseline rate: 140.  Variability: moderate (6-25 bpm).   Pattern: accelerations present and no decelerations.    Fetal State Assessment: Category I - tracings are normal.     Physical Exam  Nursing note and vitals reviewed. Constitutional: She is oriented to person, place, and time. She appears well-developed and well-nourished. No distress.  HENT:  Head: Normocephalic and atraumatic.  Cardiovascular: Normal rate.   Respiratory: Effort normal.  GI: Soft. Bowel sounds are normal. She exhibits no mass. There is no rebound and no guarding.  Genitourinary: There is no rash or lesion on the right labia. There is no rash or lesion on the left labia. Uterus is not tender. Enlarged: Size c/w dates. Cervix exhibits discharge (pooling of clear fluid). No tenderness or bleeding around the vagina. Vaginal discharge found.       SVE: 1/thick/high/posterior  Musculoskeletal: Normal range of motion.  Neurological: She is alert and oriented to person, place, and time.  Skin: Skin is warm and dry.  Psychiatric: She has a normal mood and affect.    Prenatal labs: ABO, Rh:   A neg Antibody:   neg Rubella:   immune RPR:   NR HBsAg:   NR HIV:   NR GBS: Negative (08/27 0000)   Assessment/Plan: 25 y.o. G3P2002 at [redacted]w[redacted]d PROM Admit for repeat c/s and BTL with Dr. Oleta Mouse 05/13/2011, 2:47 PM  Addendum: Patient  is scheduled for repeat cesarean section and tubal ligation on 05/18/11 due to 2 prior cesarean deliveries. We will proceed with C/S and BTL today due to SROM. The risks of anesthesia, bleeding, infection, pain, bowel and urinary tract damage was explained and her questions were answered.

## 2011-05-13 NOTE — Progress Notes (Signed)
MRSA PCR pre-op screening done

## 2011-05-13 NOTE — Op Note (Signed)
Joanna Todd PROCEDURE DATE: 05/13/2011  PREOPERATIVE DIAGNOSIS: Intrauterine pregnancy at  [redacted]w[redacted]d weeks gestation; previous low transverse cesarean section and ruptured membranes.  POSTOPERATIVE DIAGNOSIS: The same  PROCEDURE:     Cesarean Section  SURGEON:  Dr Scheryl Darter   ASSISTANT: Dr Candelaria Celeste  INDICATIONS: Joanna Todd is a 25 y.o. [id number] at [redacted]w[redacted]d scheduled for cesarean section secondary to previous low transverse cesarean section and ruptured membranes..  The risks of cesarean section discussed with the patient included but were not limited to: bleeding which may require transfusion or reoperation; infection which may require antibiotics; injury to bowel, bladder, ureters or other surrounding organs; injury to the fetus; need for additional procedures including hysterectomy in the event of a life-threatening hemorrhage; placental abnormalities wth subsequent pregnancies, incisional problems, thromboembolic phenomenon and other postoperative/anesthesia complications. The patient concurred with the proposed plan, giving informed written consent for the procedure.    FINDINGS:  Viable female infant in cephalic presentation.  Apgars 9 and 9, weight, 7 pounds and 9 ounces.  Clear amniotic fluid.  Intact placenta, three vessel cord.  Normal uterus, fallopian tubes and ovaries bilaterally.  ANESTHESIA:    Spinal INTRAVENOUS FLUIDS: 2000 ml ESTIMATED BLOOD LOSS: 700 ml URINE OUTPUT:  600 ml SPECIMENS: Placenta sent to L&D COMPLICATIONS: None immediate  PROCEDURE IN DETAIL:  The patient received intravenous antibiotics and had sequential compression devices applied to her lower extremities while in the preoperative area.  She was then taken to the operating room where spinal anesthesia was administered and was found to be adequate. She was then placed in a dorsal supine position with a leftward tilt, and prepped and draped in a sterile manner.  A foley catheter was placed into her  bladder and attached to constant gravity.  After an adequate timeout was performed, a Pfannenstiel skin incision was made with scalpel and carried through to the underlying layer of fascia. The fascia was incised in the midline and this incision was extended bilaterally using the Mayo scissors. Kocher clamps were applied to the superior aspect of the fascial incision and the underlying rectus muscles were dissected off bluntly. A similar process was carried out on the inferior aspect of the facial incision. The rectus muscles were separated in the midline bluntly and the peritoneum was entered bluntly. Attention was turned to the lower uterine segment where a bladder flap was created, and a transverse hysterotomy was made with a scalpel and extended bilaterally bluntly. The bladder blade was then removed. The infant was successfully delivered, and cord was clamped and cut and infant was handed over to awaiting neonatology team. Uterine massage was then administered and the placenta delivered intact with three-vessel cord. The uterus was then  cleared of clot and debris.  The hysterotomy was closed with 0 Vicryl in a running locked fashion, and an imbricating layer was also placed with a 0 Vicryl. Overall, excellent hemostasis was noted. The right fallopian tube was identified and followed out to the fimbriated end.  A Filshie clip was placed on the right fallopian tube about 2 cm from the cornual attachment, with care given to incorporate the underlying mesosalpinx.  A similar process was carried out on the left side allowing for bilateral tubal sterilization. The uterus and the pelvis was cleared of all clot and debris. Hemostasis was confirmed on all surfaces.  The peritoneum and the muscles were reapproximated using 0 vicryl running stitches. The fascia was then closed using 0 Vicryl in a running fashion.  The subcutaneous layer was reapproximated with 0 Vicryl and the skin was closed with 4-0 Vicryl. The patient  tolerated the procedure well. Sponge, lap, instrument and needle counts were correct x 2. She was taken to the recovery room in stable condition.    Candelaria Celeste JEHIEL 05/13/2011 8:32 PM

## 2011-05-13 NOTE — Progress Notes (Signed)
Rec'd report & assumed pt care 

## 2011-05-13 NOTE — Progress Notes (Signed)
Pt presents to [facility] with complaints of contractions and leakage of fluid. Pt is 38 weeks 2 days. [facility]

## 2011-05-13 NOTE — Progress Notes (Signed)
Patient is scheduled for C/S on Thursday having contractions since this morning every 15 minutes

## 2011-05-13 NOTE — OR Nursing (Signed)
Pt given demerol for shivering.  Shivering is  now gone.

## 2011-05-14 ENCOUNTER — Encounter (HOSPITAL_COMMUNITY): Payer: Self-pay | Admitting: Anesthesiology

## 2011-05-14 LAB — CBC
MCH: 29.6 pg (ref 26.0–34.0)
MCV: 88.9 fL (ref 78.0–100.0)
Platelets: 172 10*3/uL (ref 150–400)
RDW: 13.3 % (ref 11.5–15.5)

## 2011-05-14 LAB — RPR: RPR Ser Ql: NONREACTIVE

## 2011-05-14 MED ORDER — INFLUENZA VIRUS VACC SPLIT PF IM SUSP
0.5000 mL | Freq: Once | INTRAMUSCULAR | Status: AC
  Administered 2011-05-14: 0.5 mL via INTRAMUSCULAR
  Filled 2011-05-14: qty 0.5

## 2011-05-14 NOTE — Progress Notes (Signed)
Subjective: Postpartum Day 1: Cesarean Delivery Patient reports incisional pain, tolerating PO and + flatus.    Objective: Vital signs in last 24 hours: Temp:  [97.6 F (36.4 C)-98.7 F (37.1 C)] 98.1 F (36.7 C) (09/09 0720) Pulse Rate:  [50-78] 52  (09/09 0720) Resp:  [16-20] 18  (09/09 0720) BP: (84-116)/(33-72) 84/48 mmHg (09/09 0720) SpO2:  [98 %-100 %] 100 % (09/09 0720) Weight:  [89.177 kg (196 lb 9.6 oz)] 196 lb 9.6 oz (89.177 kg) (09/08 1335)  Physical Exam:  General: alert, cooperative, appears stated age and no distress Lochia: appropriate Uterine Fundus: firm Incision: dressing dry DVT Evaluation: No evidence of DVT seen on physical exam. Negative Homan's sign. No cords or calf tenderness. No significant calf/ankle edema.   Basename 05/14/11 0524 05/13/11 1545  HGB 9.3* 11.3*  HCT 27.9* 33.0*    Assessment/Plan: Status post Cesarean section. Doing well postoperatively.  Continue current care.  Zerita Boers 05/14/2011, 8:58 AM

## 2011-05-14 NOTE — Anesthesia Postprocedure Evaluation (Signed)
  Anesthesia Post-op Note  Patient: Joanna Todd  Procedure(s) Performed:  CESAREAN SECTION  Patient Location: PACU and Mother/Baby  Anesthesia Type: Spinal  Level of Consciousness: awake, alert  and oriented  Airway and Oxygen Therapy: Patient Spontanous Breathing  Post-op Pain: none  Post-op Assessment: Post-op Vital signs reviewed and Patient's Cardiovascular Status Stable  Post-op Vital Signs: Reviewed and stable  Complications: No apparent anesthesia complications

## 2011-05-15 ENCOUNTER — Other Ambulatory Visit (HOSPITAL_COMMUNITY): Payer: Medicaid Other

## 2011-05-15 DIAGNOSIS — Z98891 History of uterine scar from previous surgery: Secondary | ICD-10-CM | POA: Diagnosis not present

## 2011-05-15 MED ORDER — FERROUS SULFATE 325 (65 FE) MG PO TABS: 325.0000 mg | ORAL_TABLET | Freq: Every day | ORAL | Status: DC

## 2011-05-15 MED ORDER — OXYCODONE-ACETAMINOPHEN 5-325 MG PO TABS: 1.0000 | ORAL_TABLET | ORAL | Status: AC | PRN

## 2011-05-15 MED ORDER — IBUPROFEN 600 MG PO TABS: 600.0000 mg | ORAL_TABLET | Freq: Four times a day (QID) | ORAL | Status: AC

## 2011-05-15 MED ORDER — RHO D IMMUNE GLOBULIN 1500 UNIT/2ML IJ SOLN
300.0000 ug | Freq: Once | INTRAMUSCULAR | Status: AC
  Administered 2011-05-15: 300 ug via INTRAMUSCULAR
  Filled 2011-05-15: qty 2

## 2011-05-15 NOTE — Progress Notes (Signed)
Subjective: Postpartum Day 2: Cesarean Delivery Patient reports tolerating PO, + flatus and no problems voiding.    Objective: Vital signs in last 24 hours: Temp:  [97.7 F (36.5 C)-98.3 F (36.8 C)] 98.3 F (36.8 C) (09/10 0616) Pulse Rate:  [48-61] 57  (09/10 0616) Resp:  [18-20] 18  (09/10 0616) BP: (92-119)/(53-62) 119/61 mmHg (09/10 0616) SpO2:  [98 %-100 %] 99 % (09/09 2000)  Physical Exam:  General: alert, cooperative and no distress Lochia: appropriate Uterine Fundus: firm Incision: healing well, no significant drainage, no significant erythema DVT Evaluation: No evidence of DVT seen on physical exam. No significant calf/ankle edema.   Basename 05/14/11 0524 05/13/11 1545  HGB 9.3* 11.3*  HCT 27.9* 33.0*    Assessment/Plan: Status post Cesarean section. Doing well postoperatively.  Discharge home with standard precautions and return to clinic in 4-6 weeks. Anemia-start PO iron. Contraception-BTL Pain control- percocet and motrin.   Joanna Todd 05/15/2011, 7:51 AM

## 2011-05-15 NOTE — Progress Notes (Signed)
UR chart review completed.  

## 2011-05-15 NOTE — Discharge Summary (Signed)
Obstetric Discharge Summary Reason for Admission: cesarean section, repeat Prenatal Procedures: none Intrapartum Procedures: cesarean: low cervical, transverse and tubal ligation Postpartum Procedures: none Complications-Operative and Postpartum: none Hemoglobin  Date Value Range Status  05/14/2011 9.3* 12.0-15.0 (g/dL) Final     DELTA CHECK NOTED     REPEATED TO VERIFY     HCT  Date Value Range Status  05/14/2011 27.9* 36.0-46.0 (%) Final    Discharge Diagnoses: Term Pregnancy-delivered  Discharge Information: Date: 05/15/2011 Activity: pelvic rest Diet: routine Medications: Iron, Percocet and motrin. Condition: stable Instructions: refer to practice specific booklet Discharge to: home Follow-up Information    Follow up with [facility]. Call in 6 weeks.         Newborn Data: Live born female  Birth Weight: 7 lb 9.2 oz (3435 g) APGAR: 9, 9  Home with mother.  Joanna Todd 05/15/2011, 8:50 AM

## 2011-05-16 LAB — RH IG WORKUP (INCLUDES ABO/RH)
Gestational Age(Wks): 39.5
Unit division: 0

## 2011-05-17 ENCOUNTER — Inpatient Hospital Stay (HOSPITAL_COMMUNITY)
Admission: AD | Admit: 2011-05-17 | Discharge: 2011-05-17 | Disposition: A | Discharge status: Home / Self Care | Payer: Medicaid Other | Source: Ambulatory Visit | Attending: Obstetrics & Gynecology | Admitting: Obstetrics & Gynecology

## 2011-05-17 ENCOUNTER — Encounter (HOSPITAL_COMMUNITY): Payer: Self-pay | Admitting: *Deleted

## 2011-05-17 DIAGNOSIS — R5383 Other fatigue: Secondary | ICD-10-CM

## 2011-05-17 DIAGNOSIS — O9081 Anemia of the puerperium: Secondary | ICD-10-CM | POA: Insufficient documentation

## 2011-05-17 DIAGNOSIS — O9279 Other disorders of lactation: Secondary | ICD-10-CM | POA: Insufficient documentation

## 2011-05-17 DIAGNOSIS — Z98891 History of uterine scar from previous surgery: Secondary | ICD-10-CM

## 2011-05-17 DIAGNOSIS — O864 Pyrexia of unknown origin following delivery: Secondary | ICD-10-CM | POA: Insufficient documentation

## 2011-05-17 DIAGNOSIS — R509 Fever, unspecified: Secondary | ICD-10-CM

## 2011-05-17 DIAGNOSIS — K5900 Constipation, unspecified: Secondary | ICD-10-CM | POA: Insufficient documentation

## 2011-05-17 DIAGNOSIS — D649 Anemia, unspecified: Secondary | ICD-10-CM | POA: Insufficient documentation

## 2011-05-17 LAB — CBC
MCHC: 33.7 g/dL (ref 30.0–36.0)
Platelets: 199 10*3/uL (ref 150–400)
RDW: 12.8 % (ref 11.5–15.5)

## 2011-05-17 MED ORDER — DOCUSATE SODIUM 100 MG PO CAPS: 100.0000 mg | ORAL_CAPSULE | Freq: Two times a day (BID) | ORAL | Status: AC

## 2011-05-17 NOTE — ED Provider Notes (Signed)
Chief Complaint:  Post-op Problem and Fever   YILIA SACCA is  25 y.o. G3P3003 at [redacted]w[redacted]d presents complaining of Post-op Problem and Fever  POD#4 from [facility]. Procedure was uncomplicated. Patient reports that she began to feel malaised this morning. Reports some mild increased sinus drainage. Denies SOB or cough. Took temp this afternoon, noted to be 102. Took ibuprofen and percocet last at 1900, then presented to [facility]U. Also with c/o breast fullness and tenderness. She is bottle feeding. No drainage or sores. Mild abdominal tenderness. Steadily improving since d/c. Reports that lochia is decreased from discharge, no change in character or smell.   Obstetrical/Gynecological History: OB History    Grav Para Term Preterm Abortions TAB SAB Ect Mult Living   3 3 3  0 0 0 0 0 0 3      Past Medical History: Past Medical History  Diagnosis Date  . No pertinent past medical history     Past Surgical History: Past Surgical History  Procedure Date  . Cesarean section   . No past surgeries     Family History: Family History  Problem Relation Age of Onset  . Hypertension Mother     Social History: History  Substance Use Topics  . Smoking status: Current Everyday Smoker -- 0.5 packs/day  . Smokeless tobacco: Former Neurosurgeon    Quit date: 05/16/2011  . Alcohol Use: No    Allergies: No Known Allergies  Prescriptions prior to admission  Medication Sig Dispense Refill  . ferrous sulfate 325 (65 FE) MG tablet Take 1 tablet (325 mg total) by mouth daily with breakfast.  90 tablet  6  . ibuprofen (ADVIL,MOTRIN) 600 MG tablet Take 1 tablet (600 mg total) by mouth every 6 (six) hours.  30 tablet    . oxyCODONE-acetaminophen (PERCOCET) 5-325 MG per tablet Take 1-2 tablets by mouth every 3 (three) hours as needed (moderate - severe pain).  20 tablet  0    Review of Systems - Negative except as mentioned in HPI  Physical Exam   Blood pressure 115/59, pulse 81, temperature 102.2 F (39 C),  temperature source Oral, resp. rate 20, height 5\' 4"  (1.626 m), weight 191 lb 4 oz (86.75 kg), not currently breastfeeding.  General: General appearance - alert, well appearing, and in no distress Eyes - pupils equal and reactive, extraocular eye movements intact Nose - normal and patent, no erythema, discharge or polyps Mouth - mucous membranes moist, pharynx normal without lesions Lymphatics - no palpable lymphadenopathy Chest - clear to auscultation, no wheezes, rales or rhonchi, symmetric air entry Heart - normal rate, regular rhythm, normal S1, S2, no murmurs, rubs, clicks or gallops; incision is c/d/i. No focal tenderness. No erythema or induration. Abdomen - soft; mild appropriately TTP given POD#4 Breasts - no focal tenderness; engorged, no drainage   Labs: No results found for this or any previous visit (from the past 24 hour(s)). Imaging Studies:  US Ob Follow Up  04/19/2011  OBSTETRICAL ULTRASOUND: This exam was performed within a [facility]. The OB US report was generated in the [facility] system, and faxed to the ordering physician.   This report is also available in [employer] and in the [employer]. See [facility] Obstetric US report.     Assessment: SHRITHA BRESEE is  25 y.o. G3P3003 at [redacted]w[redacted]d presents with post-op fever and breast engorgement.  Plan: 1)Fever - CBC wnl; fever responding well to tylenol/ibuprofen. Continue. Likely 2/2 engorgement. After discussion patient has decided  to attempt breastfeeding. Patient provided breast pump in the room. Will attempt further breastfeeding at home, f/u with [facility] tomorrow to arrange for pump at home. 2)Anemia - asymptomatic. Will give rx for FeSO4. F/u as o/p.  3)Constipation - No BM since c/s. +Flatus. Colace. Discussed other OTC options if no BM in next 24-48h, including enema 3)D/c home. Keep next scheduled appointment. Call clinic for any further fevers or concerns.   Daizee Firmin, Beverely Pace  A9/12/201210:11 PM

## 2011-05-17 NOTE — Progress Notes (Signed)
Pt also states " I also started having a cough and itchy throat this morning."

## 2011-05-17 NOTE — Progress Notes (Addendum)
Pt asked 'is  There anyway you can help me get rid of some of this" indicating her breast. Pt's breast appear hard and engorged. Pt informed that if we pumped milk out of her breast it would be best if she tried to breast feed. Pt's mother also supportive regarding breastfeeding.Pt informed that it is not unusual to get a fever with breast engorgement. Dr. Elesa Massed informed of pt's breast engorgement.

## 2011-05-17 NOTE — Progress Notes (Signed)
Pt states, " I started having body aches at 2 pm, and took my temp at 7pm and it was 102.1. I woke this morning and noticed red bumps on my legs and arms."

## 2011-05-17 NOTE — Progress Notes (Signed)
Pt instructed on the use of breast pump. Patient assisted with breast pump and made comfortable.

## 2011-05-17 NOTE — Progress Notes (Signed)
Pt states she started like her throat was hurting and about 1430-1500 pt state she took her temperature and it was 102.1 Pt states she took her percocet and her ibuprofen about 1900.

## 2011-05-18 ENCOUNTER — Inpatient Hospital Stay (HOSPITAL_COMMUNITY)
Admission: RE | Admit: 2011-05-18 | Payer: Medicaid Other | Source: Ambulatory Visit | Admitting: Obstetrics & Gynecology

## 2011-05-18 ENCOUNTER — Encounter (HOSPITAL_COMMUNITY): Admission: RE | Payer: Self-pay | Source: Ambulatory Visit

## 2011-05-18 SURGERY — Surgical Case
Anesthesia: Spinal

## 2011-05-24 NOTE — Discharge Summary (Signed)
Agree with above note.  Joanna Todd 05/24/2011 9:04 AM

## 2013-10-10 LAB — HM PAP SMEAR

## 2013-11-10 ENCOUNTER — Telehealth: Payer: Self-pay

## 2013-11-10 NOTE — Telephone Encounter (Signed)
Medication and allergies:  Reviewed and updated  90 day supply/mail order: n/a Local pharmacy:  [id number] - [place] ([place]), [place] - [address]   Immunizations due:  Influenza-declined; TDAP   A/P: No changes to personal, family history or past surgical hx PAP- 10/10/13- negative per patient Flu- declined Tdap- unsure of last vaccine; greater than 10 years   To Discuss with Provider: Experiences signs of anxiety: "stomach issues in the morning," anxious thoughts, and tensed muscles.

## 2013-11-13 ENCOUNTER — Encounter: Payer: Self-pay | Admitting: Family Medicine

## 2013-11-13 ENCOUNTER — Ambulatory Visit (INDEPENDENT_AMBULATORY_CARE_PROVIDER_SITE_OTHER): Payer: Private Health Insurance - Indemnity | Admitting: Family Medicine

## 2013-11-13 VITALS — BP 108/68 | HR 70 | Temp 98.1°F | Ht 64.0 in | Wt 158.8 lb

## 2013-11-13 DIAGNOSIS — F411 Generalized anxiety disorder: Secondary | ICD-10-CM

## 2013-11-13 MED ORDER — CITALOPRAM HYDROBROMIDE 20 MG PO TABS: 20.0000 mg | ORAL_TABLET | Freq: Every day | ORAL | Status: DC

## 2013-11-13 NOTE — Progress Notes (Signed)
Pre visit review using our clinic review tool, if applicable. No additional management support is needed unless otherwise documented below in the visit note. 

## 2013-11-13 NOTE — Progress Notes (Signed)
  Subjective:     Joanna Todd is a 28 y.o. female who presents for new evaluation and treatment of anxiety disorder and panic attacks. She has the following anxiety symptoms: stomach pains, irritable. Onset of symptoms was approximately 5 years ago. Symptoms have been stable since that time. She denies current suicidal and homicidal ideation. Family history significant for anxiety. Risk factors: positive family history in  sister(s). Previous treatment includes nothing. She complains of the following medication side effects: none. The following portions of the patient's history were reviewed and updated as appropriate: allergies, current medications, past family history, past medical history, past social history, past surgical history and problem list.  Review of Systems Pertinent items are noted in HPI.    Objective:    BP 108/68  Pulse 70  Temp(Src) 98.1 F (36.7 C) (Oral)  Ht 5\' 4"  (1.626 m)  Wt 158 lb 12.8 oz (72.031 kg)  BMI 27.24 kg/m2  SpO2 98%  LMP 11/13/2013  Breastfeeding? No General appearance: alert, cooperative and no distress Neck: no adenopathy, supple, symmetrical, trachea midline and thyroid not enlarged, symmetric, no tenderness/mass/nodules Lungs: clear to auscultation bilaterally Heart: S1, S2 normal Neurologic: Alert and oriented X 3, normal strength and tone. Normal symmetric reflexes. Normal coordination and gait    Assessment:    anxiety disorder and panic attacks. Possible organic contributing causes are: none.   Plan:    Medications: Celexa. Labs: Comprehensive metabolic profile, CBC and TSH. Handouts describing disease, natural history, and treatment were given to the patient. Follow up: 1 month. Spent 25 minutes (>50% of visit) discussing the risks of anxiety disorder, the  pathophysiology, etiology, risks, and principles of treatment.

## 2013-11-13 NOTE — Patient Instructions (Signed)

## 2013-11-14 ENCOUNTER — Telehealth: Payer: Self-pay | Admitting: Family Medicine

## 2013-11-14 LAB — CBC WITH DIFFERENTIAL/PLATELET
BASOS PCT: 0.2 % (ref 0.0–3.0)
Basophils Absolute: 0 10*3/uL (ref 0.0–0.1)
EOS ABS: 0.1 10*3/uL (ref 0.0–0.7)
EOS PCT: 0.7 % (ref 0.0–5.0)
HEMATOCRIT: 38.2 % (ref 36.0–46.0)
Hemoglobin: 12.9 g/dL (ref 12.0–15.0)
LYMPHS ABS: 2.7 10*3/uL (ref 0.7–4.0)
Lymphocytes Relative: 22.8 % (ref 12.0–46.0)
MCHC: 33.8 g/dL (ref 30.0–36.0)
MCV: 86.7 fl (ref 78.0–100.0)
MONO ABS: 0.3 10*3/uL (ref 0.1–1.0)
Monocytes Relative: 3 % (ref 3.0–12.0)
NEUTROS PCT: 73.3 % (ref 43.0–77.0)
Neutro Abs: 8.6 10*3/uL — ABNORMAL HIGH (ref 1.4–7.7)
PLATELETS: 259 10*3/uL (ref 150.0–400.0)
RBC: 4.41 Mil/uL (ref 3.87–5.11)
RDW: 13 % (ref 11.5–14.6)
WBC: 11.7 10*3/uL — ABNORMAL HIGH (ref 4.5–10.5)

## 2013-11-14 LAB — POCT URINALYSIS DIPSTICK
Bilirubin, UA: NEGATIVE
Blood, UA: NEGATIVE
GLUCOSE UA: NEGATIVE
Ketones, UA: NEGATIVE
LEUKOCYTES UA: NEGATIVE
NITRITE UA: NEGATIVE
PROTEIN UA: NEGATIVE
UROBILINOGEN UA: 0.2
pH, UA: 6

## 2013-11-14 LAB — BASIC METABOLIC PANEL
BUN: 7 mg/dL (ref 6–23)
CHLORIDE: 105 meq/L (ref 96–112)
CO2: 25 meq/L (ref 19–32)
CREATININE: 0.7 mg/dL (ref 0.4–1.2)
Calcium: 9.2 mg/dL (ref 8.4–10.5)
GFR: 116.2 mL/min (ref >60.00)
Glucose, Bld: 72 mg/dL (ref 70–99)
POTASSIUM: 3.7 meq/L (ref 3.5–5.1)
Sodium: 139 mEq/L (ref 135–145)

## 2013-11-14 LAB — HEPATIC FUNCTION PANEL
ALT: 14 U/L (ref 0–35)
AST: 20 U/L (ref 0–37)
Albumin: 4.4 g/dL (ref 3.5–5.2)
Alkaline Phosphatase: 48 U/L (ref 39–117)
BILIRUBIN DIRECT: 0 mg/dL (ref 0.0–0.3)
TOTAL PROTEIN: 7.3 g/dL (ref 6.0–8.3)
Total Bilirubin: 0.6 mg/dL (ref 0.3–1.2)

## 2013-11-14 LAB — POCT URINE PREGNANCY: PREG TEST UR: NEGATIVE

## 2013-11-14 LAB — TSH: TSH: 0.77 u[IU]/mL (ref 0.35–5.50)

## 2013-11-14 NOTE — Telephone Encounter (Signed)
Relevant patient education assigned to patient using Emmi. ° °

## 2013-11-26 ENCOUNTER — Encounter: Payer: Self-pay | Admitting: Nurse Practitioner

## 2013-11-26 ENCOUNTER — Ambulatory Visit (INDEPENDENT_AMBULATORY_CARE_PROVIDER_SITE_OTHER): Payer: Private Health Insurance - Indemnity | Admitting: Nurse Practitioner

## 2013-11-26 VITALS — BP 109/74 | HR 80 | Temp 98.4°F | Ht 64.0 in | Wt 160.0 lb

## 2013-11-26 DIAGNOSIS — J029 Acute pharyngitis, unspecified: Secondary | ICD-10-CM

## 2013-11-26 MED ORDER — PENICILLIN V POTASSIUM 500 MG PO TABS: ORAL_TABLET | ORAL | Status: DC

## 2013-11-26 NOTE — Patient Instructions (Addendum)
Start antibiotic. Gargle with salt water (1/4 tsp salt mixed w/1/4 cup warm water) several times daily. Listerene gargles twice daily. Eat yogurt to help prevent antibiotic associated diarrhea.  Strep Throat Strep throat is an infection of the throat caused by a bacteria named Streptococcus pyogenes. Your caregiver may call the infection streptococcal "tonsillitis" or "pharyngitis" depending on whether there are signs of inflammation in the tonsils or back of the throat. Strep throat is most common in children aged 5 15 years during the cold months of the year, but it can occur in people of any age during any season. This infection is spread from person to person (contagious) through coughing, sneezing, or other close contact. SYMPTOMS   Fever or chills.  Painful, swollen, red tonsils or throat.  Pain or difficulty when swallowing.  White or yellow spots on the tonsils or throat.  Swollen, tender lymph nodes or "glands" of the neck or under the jaw.  Red rash all over the body (rare). DIAGNOSIS  Many different infections can cause the same symptoms. A test must be done to confirm the diagnosis so the right treatment can be given. A "rapid strep test" can help your caregiver make the diagnosis in a few minutes. If this test is not available, a light swab of the infected area can be used for a throat culture test. If a throat culture test is done, results are usually available in a day or two. TREATMENT  Strep throat is treated with antibiotic medicine. HOME CARE INSTRUCTIONS   Gargle with 1 tsp of salt in 1 cup of warm water, 3 4 times per day or as needed for comfort.  Family members who also have a sore throat or fever should be tested for strep throat and treated with antibiotics if they have the strep infection.  Make sure everyone in your household washes their hands well.  Do not share food, drinking cups, or personal items that could cause the infection to spread to others.  You  may need to eat a soft food diet until your sore throat gets better.  Drink enough water and fluids to keep your urine clear or pale yellow. This will help prevent dehydration.  Get plenty of rest.  Stay home from school, daycare, or work until you have been on antibiotics for 24 hours.  Only take over-the-counter or prescription medicines for pain, discomfort, or fever as directed by your caregiver.  If antibiotics are prescribed, take them as directed. Finish them even if you start to feel better. SEEK MEDICAL CARE IF:   The glands in your neck continue to enlarge.  You develop a rash, cough, or earache.  You cough up green, yellow-brown, or bloody sputum.  You have pain or discomfort not controlled by medicines.  Your problems seem to be getting worse rather than better. SEEK IMMEDIATE MEDICAL CARE IF:   You develop any new symptoms such as vomiting, severe headache, stiff or painful neck, chest pain, shortness of breath, or trouble swallowing.  You develop severe throat pain, drooling, or changes in your voice.  You develop swelling of the neck, or the skin on the neck becomes red and tender.  You have a fever.  You develop signs of dehydration, such as fatigue, dry mouth, and decreased urination.  You become increasingly sleepy, or you cannot wake up completely. Document Released: 08/18/2000 Document Revised: 08/07/2012 Document Reviewed: 10/20/2010 [facility] Patient Information 2014 [place], [place].

## 2013-11-26 NOTE — Progress Notes (Signed)
Pre visit review using our clinic review tool, if applicable. No additional management support is needed unless otherwise documented below in the visit note. 

## 2013-11-26 NOTE — Progress Notes (Signed)
   Subjective:    Patient ID: Joanna Todd, female    DOB: 03/29/1986, 28 y.o.   MRN: [id number]  Sore Throat  This is a new problem. The current episode started today. The problem has been gradually worsening. Neither side of throat is experiencing more pain than the other. There has been no fever. The pain is moderate. Associated symptoms include congestion and headaches. Pertinent negatives include no abdominal pain, coughing, diarrhea, ear pain, hoarse voice, neck pain, shortness of breath, swollen glands, trouble swallowing or vomiting. Associated symptoms comments: Body aches.. She has had exposure to strep. Exposure to: son has strep. Treatments tried: alka-seltzer plus. The treatment provided no relief.      Review of Systems  Constitutional: Positive for fatigue. Negative for fever, chills, activity change and appetite change.  HENT: Positive for congestion, postnasal drip and sore throat. Negative for ear pain, hoarse voice and trouble swallowing.   Respiratory: Negative for cough, chest tightness, shortness of breath and wheezing.   Gastrointestinal: Negative for nausea, vomiting, abdominal pain and diarrhea.  Musculoskeletal: Positive for myalgias. Negative for neck pain.  Neurological: Positive for headaches.       Objective:   Physical Exam  Vitals reviewed. Constitutional: She is oriented to person, place, and time. She appears well-developed and well-nourished. She appears distressed (mild, looks tired, states feels bad).  HENT:  Head: Normocephalic and atraumatic.  Right Ear: External ear normal.  Left Ear: External ear normal.  Mouth/Throat: No oropharyngeal exudate.  Posterior pharynx erythematous. R tonsil slightly larger than L. bilat effusion. Bones visible.  Eyes: Right eye exhibits no discharge. Left eye exhibits no discharge.  bilat conjunctival injection   Neck: Normal range of motion. Neck supple. No thyromegaly present.  Cardiovascular: Normal rate,  regular rhythm and normal heart sounds.   No murmur heard. Pulmonary/Chest: Effort normal and breath sounds normal. No respiratory distress. She has no wheezes. She has no rales.  Lymphadenopathy:    She has no cervical adenopathy.  Neurological: She is alert and oriented to person, place, and time.  Skin: Skin is warm and dry.  Psychiatric: She has a normal mood and affect. Her behavior is normal. Thought content normal.          Assessment & Plan:  1. Sore throat  - POCT Rapid Strep A-positive - penicillin v potassium (VEETID) 500 MG tablet; 1T po BID for 10 days  Dispense: 20 tablet; Refill: 0 See pt instructions.

## 2013-11-28 ENCOUNTER — Telehealth: Payer: Self-pay | Admitting: Family Medicine

## 2013-11-28 NOTE — Telephone Encounter (Signed)
4-5 doses is not enough to know if medicine will work. She should continue. She should change toothbrush. If no improvement by Monday, I will change ABX.

## 2013-11-28 NOTE — Telephone Encounter (Signed)
Patient called and stated that she was seen on Wednesday and is still not feeling better. Patient wanted to know if there is something else that can be called in for her. Please advise.

## 2013-11-28 NOTE — Telephone Encounter (Signed)
Patient notified and stated that she will contact office if symptoms do not improve.

## 2014-07-06 ENCOUNTER — Encounter: Payer: Self-pay | Admitting: Nurse Practitioner

## 2014-09-30 ENCOUNTER — Ambulatory Visit: Payer: Private Health Insurance - Indemnity | Admitting: Medical

## 2014-10-01 ENCOUNTER — Ambulatory Visit (INDEPENDENT_AMBULATORY_CARE_PROVIDER_SITE_OTHER): Payer: Private Health Insurance - Indemnity | Admitting: Medical

## 2014-10-01 VITALS — BP 105/70 | HR 68 | Temp 98.2°F | Ht 64.0 in | Wt 173.6 lb

## 2014-10-01 DIAGNOSIS — F4323 Adjustment disorder with mixed anxiety and depressed mood: Secondary | ICD-10-CM | POA: Insufficient documentation

## 2014-10-01 MED ORDER — CLONAZEPAM 0.5 MG PO TABS: ORAL_TABLET | ORAL | Status: DC

## 2014-10-01 MED ORDER — SERTRALINE HCL 25 MG PO TABS: 25.0000 mg | ORAL_TABLET | Freq: Every day | ORAL | Status: DC

## 2014-10-01 NOTE — Progress Notes (Signed)
   Subjective:    Patient ID: Joanna Todd, female    DOB: 01/27/1986, 29 y.o.   MRN: [id number]  HPI  Patient here for hx of anxiety and depression. She states depression for long time. Anxiety since 2010. Pt is on celexa 20 mg. Stress in past related to work and 3 children. Pt has tried celexa in the past. She only took one tablet and states she did not like the way she felt with this. Pt has not tried anything else in the past.   With celexa she states felt a little nausea and not like herself.   Pt has appointment with psychiatrist 18th of February. Pt is sure that it is a psychiatrist.  Daily she feels anxious and recently 2 panic attacks. She is afraid to go out. Pt just states feels uncomfortable if not at home.   Pt lmp Tuesday.  Past Medical History  Diagnosis Date  . No pertinent past medical history     Review of Systems  Constitutional: Negative for fever, chills, diaphoresis, activity change and fatigue.  Respiratory: Negative for cough, chest tightness and shortness of breath.   Cardiovascular: Negative for chest pain, palpitations and leg swelling.  Gastrointestinal: Negative for nausea, vomiting and abdominal pain.  Musculoskeletal: Negative for neck pain and neck stiffness.  Neurological: Negative for dizziness, tremors, seizures, syncope, facial asymmetry, speech difficulty, weakness, light-headedness, numbness and headaches.  Psychiatric/Behavioral: Positive for dysphoric mood. Negative for suicidal ideas, hallucinations, behavioral problems, confusion, sleep disturbance, self-injury, decreased concentration and agitation. The patient is nervous/anxious. The patient is not hyperactive.        Depression mostly controlled. Anxiety is the main issue.       Objective:    Physical Exam   General Mental Status- Alert. General Appearance- Not in acute distress.     .  Chest and Lung Exam Auscultation: Breath  Sounds:-Normal.  Cardiovascular Auscultation:Rythm- Regular. Murmurs & Other Heart Sounds:Auscultation of the heart reveals- No Murmurs.    Neurologic Cranial Nerve exam:- CN III-XII intact(No nystagmus), symmetric smile. Drift Test:- No drift. Romberg Exam:- Negative.  Finger to Nose:- Normal/Intact Strength:- 5/5 equal and symmetric strength both upper and lower extremities.  BP 105/70 mmHg  Pulse 68  Temp(Src) 98.2 F (36.8 C) (Oral)  Ht 5\' 4"  (1.626 m)  Wt 173 lb 9.6 oz (78.744 kg)  BMI 29.78 kg/m2  SpO2 98%  LMP 09/29/2014 Wt Readings from Last 3 Encounters:  10/01/14 173 lb 9.6 oz (78.744 kg)  11/26/13 160 lb (72.576 kg)  11/13/13 158 lb 12.8 oz (72.031 kg)     Lab Results  Component Value Date   WBC 11.7* 11/13/2013   HGB 12.9 11/13/2013   HCT 38.2 11/13/2013   PLT 259.0 11/13/2013   GLUCOSE 72 11/13/2013   ALT 14 11/13/2013   AST 20 11/13/2013   NA 139 11/13/2013   K 3.7 11/13/2013   CL 105 11/13/2013   CREATININE 0.7 11/13/2013   BUN 7 11/13/2013   CO2 25 11/13/2013   TSH 0.77 11/13/2013    No results found.     Assessment & Plan:

## 2014-10-01 NOTE — Patient Instructions (Signed)
For your anxiety I am prescribing sertraline and at a low dose to minimize early potential side effects. Also I am prescribing limited # of clonopin for severe anxiety or panic attacks.  Please keep the appointment in mid February with psychiatrist.  Follow up here as needed before or after psychiatrist appointment. 

## 2014-10-01 NOTE — Assessment & Plan Note (Signed)
For your anxiety I am prescribing sertraline and at a low dose to minimize early potential side effects. Also I am prescribing limited # of clonopin for severe anxiety or panic attacks.  Please keep the appointment in mid February with psychiatrist.  Follow up here as needed before or after psychiatrist appointment.

## 2014-10-01 NOTE — Progress Notes (Signed)
Pre visit review using our clinic review tool, if applicable. No additional management support is needed unless otherwise documented below in the visit note. 

## 2014-10-15 ENCOUNTER — Ambulatory Visit: Payer: Private Health Insurance - Indemnity | Admitting: Family Medicine

## 2014-10-19 ENCOUNTER — Ambulatory Visit: Payer: Private Health Insurance - Indemnity | Admitting: Family Medicine

## 2014-10-23 ENCOUNTER — Other Ambulatory Visit: Payer: Self-pay | Admitting: Obstetrics & Gynecology

## 2014-10-26 LAB — CYTOLOGY - PAP

## 2014-11-20 ENCOUNTER — Encounter: Payer: Self-pay | Admitting: Medical

## 2014-11-20 ENCOUNTER — Ambulatory Visit (INDEPENDENT_AMBULATORY_CARE_PROVIDER_SITE_OTHER): Payer: Managed Care, Other (non HMO) | Admitting: Medical

## 2014-11-20 VITALS — BP 103/73 | HR 67 | Temp 97.7°F | Ht 64.0 in | Wt 164.6 lb

## 2014-11-20 DIAGNOSIS — M940 Chondrocostal junction syndrome [Tietze]: Secondary | ICD-10-CM | POA: Diagnosis not present

## 2014-11-20 DIAGNOSIS — J301 Allergic rhinitis due to pollen: Secondary | ICD-10-CM | POA: Diagnosis not present

## 2014-11-20 DIAGNOSIS — J309 Allergic rhinitis, unspecified: Secondary | ICD-10-CM | POA: Insufficient documentation

## 2014-11-20 DIAGNOSIS — J209 Acute bronchitis, unspecified: Secondary | ICD-10-CM | POA: Diagnosis not present

## 2014-11-20 MED ORDER — BENZONATATE 100 MG PO CAPS: 100.0000 mg | ORAL_CAPSULE | Freq: Three times a day (TID) | ORAL | Status: DC | PRN

## 2014-11-20 MED ORDER — LORATADINE 10 MG PO TABS: 10.0000 mg | ORAL_TABLET | Freq: Every day | ORAL | Status: DC

## 2014-11-20 MED ORDER — FLUTICASONE PROPIONATE 50 MCG/ACT NA SUSP: 2.0000 | Freq: Every day | NASAL | Status: DC

## 2014-11-20 MED ORDER — AZITHROMYCIN 250 MG PO TABS: ORAL_TABLET | ORAL | Status: DC

## 2014-11-20 NOTE — Assessment & Plan Note (Signed)
Onset of symptoms appear to have been 2 wks ago. Rx claritin and flonase nasal spray.

## 2014-11-20 NOTE — Assessment & Plan Note (Signed)
After allergies persisted and hx of smoking appear to now have bronchitis. Rx antibiotic azithromycin and benzonatate for cough.

## 2014-11-20 NOTE — Progress Notes (Signed)
Pre visit review using our clinic review tool, if applicable. No additional management support is needed unless otherwise documented below in the visit note. 

## 2014-11-20 NOTE — Patient Instructions (Signed)
Allergic rhinitis Onset of symptoms appear to have been 2 wks ago. Rx claritin and flonase nasal spray.   Acute bronchitis After allergies persisted and hx of smoking appear to now have bronchitis. Rx antibiotic azithromycin and benzonatate for cough.   Costochondritis Your cartlidge likely inflamed from constant cough. Recommend alleve otc 2 tab po twice daily. If you have any constant chest pain then would advise UC or ED evaluation.     Follow up in 7 days any persisting signs/symptoms or as needed.

## 2014-11-20 NOTE — Assessment & Plan Note (Signed)
Your cartlidge likely inflamed from constant cough. Recommend alleve otc 2 tab po twice daily. If you have any constant chest pain then would advise UC or ED evaluation.

## 2014-11-20 NOTE — Progress Notes (Signed)
Subjective:    Patient ID: Joanna Todd, female    DOB: 07/31/1986, 29 y.o.   MRN: [id number]  HPI  Pt in with cough for 2 weeks. She is coughing up thick colored mucous. Yesterday began with anterior chest wall pain on coughing and deep breathing. Brief 1-2 second then goes away. No pain at rest. 2 wks ago sneezing and runny nose as well.   No fever or chills throughout this illness.  LMP- October 30, 2014.  Pt is a smoker.  Review of Systems  Constitutional: Positive for fatigue. Negative for fever and chills.  HENT: Positive for congestion and sneezing. Negative for ear pain, nosebleeds, postnasal drip, sinus pressure and sore throat.   Respiratory: Positive for cough. Negative for chest tightness, shortness of breath and wheezing.        Productive cough.  Gastrointestinal: Negative for abdominal pain.  Musculoskeletal: Negative for myalgias and back pain.       Transient costochondral junction pain on deep inspiration and when she coughs.  Neurological: Negative for dizziness, facial asymmetry, light-headedness, numbness and headaches.  Hematological: Negative for adenopathy.   Past Medical History  Diagnosis Date  . No pertinent past medical history     History   Social History  . Marital Status: Married    Spouse Name: N/A  . Number of Children: N/A  . Years of Education: N/A   Occupational History  . Not on file.   Social History Main Topics  . Smoking status: Current Every Day Smoker -- 0.50 packs/day  . Smokeless tobacco: Former NeurosurgeonUser    Quit date: 05/16/2011  . Alcohol Use: No  . Drug Use: No  . Sexual Activity: Not on file   Other Topics Concern  . Not on file   Social History Narrative    Past Surgical History  Procedure Laterality Date  . Cesarean section    . No past surgeries      Family History  Problem Relation Age of Onset  . Hypertension Mother     No Known Allergies  Current Outpatient Prescriptions on File Prior to Visit    Medication Sig Dispense Refill  . clonazePAM (KLONOPIN) 0.5 MG tablet 1 tab po bid as needed severe anxiety or panic attacks. 6 tablet 0  . sertraline (ZOLOFT) 25 MG tablet Take 1 tablet (25 mg total) by mouth daily. 30 tablet 0   No current facility-administered medications on file prior to visit.    BP 103/73 mmHg  Pulse 67  Temp(Src) 97.7 F (36.5 C) (Oral)  Ht 5\' 4"  (1.626 m)  Wt 164 lb 9.6 oz (74.662 kg)  BMI 28.24 kg/m2  SpO2 100%  LMP 10/30/2014       Objective:   Physical Exam  General  Mental Status - Alert. General Appearance - Well groomed. Not in acute distress.  Skin Rashes- No Rashes.  HEENT Head- Normal. Ear Auditory Canal - Left- Normal. Right - Normal.Tympanic Membrane- Left- Normal. Right- Normal. Eye Sclera/Conjunctiva- Left- Normal. Right- Normal. Nose & Sinuses Nasal Mucosa- Left-  Boggy and Congested. Right-  Boggy and  Congested.No  maxillary and frontal sinus pressure. Mouth & Throat Lips: Upper Lip- Normal: no dryness, cracking, pallor, cyanosis, or vesicular eruption. Lower Lip-Normal: no dryness, cracking, pallor, cyanosis or vesicular eruption. Buccal Mucosa- Bilateral- No Aphthous ulcers. Oropharynx- No Discharge or Erythema. +pnd Tonsils: Characteristics- Bilateral- No Erythema or Congestion. Size/Enlargement- Bilateral- No enlargement. Discharge- bilateral-None.  Neck Neck- Supple. No Masses.   Chest and  Lung Exam Auscultation: Breath Sounds:-Clear even and unlabored. Faint upper lobe rhonchi.  Cardiovascular Auscultation:Rythm- Regular, rate and rhythm. Murmurs & Other Heart Sounds:Ausculatation of the heart reveal- No Murmurs.  Lymphatic Head & Neck General Head & Neck Lymphatics: Bilateral: Description- No Localized lymphadenopathy.  Anterior chest wall- mild pain on palpation of left lower costochandral junction.       Assessment & Plan:

## 2015-10-12 ENCOUNTER — Telehealth: Payer: Self-pay | Admitting: *Deleted

## 2015-10-12 ENCOUNTER — Ambulatory Visit (INDEPENDENT_AMBULATORY_CARE_PROVIDER_SITE_OTHER): Payer: Managed Care, Other (non HMO) | Admitting: Family

## 2015-10-12 ENCOUNTER — Encounter: Payer: Self-pay | Admitting: Family

## 2015-10-12 VITALS — BP 92/55 | HR 82 | Temp 98.0°F | Resp 18 | Ht 64.0 in | Wt 165.6 lb

## 2015-10-12 DIAGNOSIS — J029 Acute pharyngitis, unspecified: Secondary | ICD-10-CM

## 2015-10-12 LAB — POCT RAPID STREP A (OFFICE): RAPID STREP A SCREEN: NEGATIVE

## 2015-10-12 MED ORDER — LIDOCAINE VISCOUS 2 % MT SOLN: 15.0000 mL | OROMUCOSAL | Status: DC | PRN

## 2015-10-12 MED ORDER — AMOXICILLIN 500 MG PO CAPS: 500.0000 mg | ORAL_CAPSULE | Freq: Two times a day (BID) | ORAL | Status: DC

## 2015-10-12 NOTE — Progress Notes (Signed)
   Subjective:    Patient ID: Joanna Todd, female    DOB: [birth year], 30 y.o.   MRN: [id number]  HPI  Pt is a 30  Yr old with 1 day hx of sore throat. Reports sore throat is "really bad."  + cervical LAD. She denies fever. Has tried aleve with some improvement in her pain. + nasal congestion.  + body aches. She has not had a flu shot due to hx of reaction to flu shot.     Review of Systems See HPI  Past Medical History  Diagnosis Date  . No pertinent past medical history     Social History   Social History  . Marital Status: Married    Spouse Name: N/A  . Number of Children: N/A  . Years of Education: N/A   Occupational History  . Not on file.   Social History Main Topics  . Smoking status: Current Every Day Smoker -- 0.50 packs/day  . Smokeless tobacco: Former Neurosurgeon    Quit date: 05/16/2011  . Alcohol Use: No  . Drug Use: No  . Sexual Activity: Not on file   Other Topics Concern  . Not on file   Social History Narrative    Past Surgical History  Procedure Laterality Date  . Cesarean section    . No past surgeries      Family History  Problem Relation Age of Onset  . Hypertension Mother     No Known Allergies  Current Outpatient Prescriptions on File Prior to Visit  Medication Sig Dispense Refill  . clonazePAM (KLONOPIN) 0.5 MG tablet 1 tab po bid as needed severe anxiety or panic attacks. 6 tablet 0   No current facility-administered medications on file prior to visit.    BP 92/55 mmHg  Pulse 82  Temp(Src) 98 F (36.7 C) (Oral)  Resp 18  Ht  (1.626 m)  Wt 165 lb 9.6 oz (75.116 kg)  BMI 28.41 kg/m2  SpO2 100%  LMP 09/28/2015       Objective:   Physical Exam  Constitutional: She appears well-developed and well-nourished.  HENT:  Head: Normocephalic and atraumatic.  Right Ear: Tympanic membrane and ear canal normal.  Left Ear: Tympanic membrane and ear canal normal.  Mouth/Throat: Oropharyngeal exudate and posterior  oropharyngeal erythema present. No posterior oropharyngeal edema.  Neck:  Cervical LAD L>R  Cardiovascular: Normal rate, regular rhythm and normal heart sounds.   No murmur heard. Pulmonary/Chest: Effort normal and breath sounds normal. No respiratory distress. She has no wheezes.  Lymphadenopathy:    She has cervical adenopathy.  Psychiatric: She has a normal mood and affect. Her behavior is normal. Judgment and thought content normal.          Assessment & Plan:  Pharyngitis- rapid strep is negative. But clinically looks like strep. Will rx with amoxicillin, viscous lidocaine and motrin as needed for pain. Follow up if symptoms worsen or if not improved in 3 days.

## 2015-10-12 NOTE — Patient Instructions (Signed)
Start amoxcillin. You may uses viscous lidocaine gargle and spit every 4 hours as needed. You may also use ibuprofen as needed for pain. Call if symptoms worsen, or if symptoms are not improved in 3 days. Make sure to drink plenty of fluids to avoid dehydration.

## 2015-10-12 NOTE — Progress Notes (Signed)
Pre visit review using our clinic review tool, if applicable. No additional management support is needed unless otherwise documented below in the visit note. 

## 2015-10-12 NOTE — Telephone Encounter (Signed)
Received fax from [facility] needing clarification of quantity of Lidocaine 2%. 20mL was sent and directions are for 15mL by mouth as needed for mouth pain. Please advise?

## 2015-10-13 NOTE — Telephone Encounter (Signed)
So original quantity sent was 20mL.... How many mLs will be sufficient for below directions?

## 2015-10-13 NOTE — Telephone Encounter (Signed)
15 ml gargle and spit q4hr prn throat pain

## 2015-10-13 NOTE — Telephone Encounter (Signed)
Per verbal from PCP, ok to send . Quantity called to Heather at [facility] and left message on pt's voicemail that Rx was completed and to call if any questions.

## 2015-10-13 NOTE — Telephone Encounter (Signed)
Should be 15 ml please.

## 2015-10-13 NOTE — Telephone Encounter (Signed)
Just to clarify, pt is only supposed to use this one time?

## 2015-12-13 ENCOUNTER — Encounter: Payer: Self-pay | Admitting: Medical

## 2015-12-13 ENCOUNTER — Ambulatory Visit (INDEPENDENT_AMBULATORY_CARE_PROVIDER_SITE_OTHER): Payer: Managed Care, Other (non HMO) | Admitting: Medical

## 2015-12-13 VITALS — BP 90/60 | HR 77 | Temp 98.2°F | Ht 64.0 in | Wt 164.0 lb

## 2015-12-13 DIAGNOSIS — R05 Cough: Secondary | ICD-10-CM

## 2015-12-13 DIAGNOSIS — R6883 Chills (without fever): Secondary | ICD-10-CM

## 2015-12-13 DIAGNOSIS — R059 Cough, unspecified: Secondary | ICD-10-CM

## 2015-12-13 DIAGNOSIS — J111 Influenza due to unidentified influenza virus with other respiratory manifestations: Secondary | ICD-10-CM

## 2015-12-13 LAB — POC INFLUENZA A&B (BINAX/QUICKVUE)
INFLUENZA A, POC: NEGATIVE
INFLUENZA B, POC: NEGATIVE

## 2015-12-13 MED ORDER — FLUTICASONE PROPIONATE 50 MCG/ACT NA SUSP: 2.0000 | Freq: Every day | NASAL | Status: DC

## 2015-12-13 MED ORDER — OSELTAMIVIR PHOSPHATE 75 MG PO CAPS: 75.0000 mg | ORAL_CAPSULE | Freq: Two times a day (BID) | ORAL | Status: DC

## 2015-12-13 MED ORDER — HYDROCODONE-HOMATROPINE 5-1.5 MG/5ML PO SYRP: 5.0000 mL | ORAL_SOLUTION | Freq: Three times a day (TID) | ORAL | Status: DC | PRN

## 2015-12-13 NOTE — Progress Notes (Signed)
Subjective:    Patient ID: Joanna Todd, female    DOB: 01/08/1986, 30 y.o.   MRN: [id number]  HPI  Pt has rapid acute onset of illness  2 days ago. Reports fever, body aches, fatigue ,runny nose,  And dry cough. No wheezing. When breaths deep will cough. Pt states severe body aches really came on today. Pt did not get flu vaccine this year. She thinks when get flu vaccine will get sick.  LMP- on presenltly.  Review of Systems  Constitutional: Positive for fever, chills and fatigue.  HENT: Positive for congestion and rhinorrhea. Negative for sinus pressure, sneezing and tinnitus.   Respiratory: Positive for cough. Negative for chest tightness, shortness of breath and wheezing.   Cardiovascular: Negative for chest pain and palpitations.  Gastrointestinal: Negative for abdominal pain.  Musculoskeletal: Positive for myalgias. Negative for arthralgias.  Neurological: Negative for dizziness and headaches.  Hematological: Negative for adenopathy. Does not bruise/bleed easily.  Psychiatric/Behavioral: Negative for behavioral problems and confusion.     Past Medical History  Diagnosis Date  . No pertinent past medical history     Social History   Social History  . Marital Status: Married    Spouse Name: N/A  . Number of Children: N/A  . Years of Education: N/A   Occupational History  . Not on file.   Social History Main Topics  . Smoking status: Current Every Day Smoker -- 0.50 packs/day  . Smokeless tobacco: Former NeurosurgeonUser    Quit date: 05/16/2011  . Alcohol Use: No  . Drug Use: No  . Sexual Activity: Not on file   Other Topics Concern  . Not on file   Social History Narrative    Past Surgical History  Procedure Laterality Date  . Cesarean section    . No past surgeries      Family History  Problem Relation Age of Onset  . Hypertension Mother     No Known Allergies  Current Outpatient Prescriptions on File Prior to Visit  Medication Sig Dispense Refill    . clonazePAM (KLONOPIN) 0.5 MG tablet 1 tab po bid as needed severe anxiety or panic attacks. 6 tablet 0  . DULoxetine (CYMBALTA) 30 MG capsule Take 30 mg by mouth daily.    Marland Kitchen. lidocaine (XYLOCAINE) 2 % solution Use as directed 15 mLs in the mouth or throat as needed for mouth pain. Gargle and spit 20 mL 0   No current facility-administered medications on file prior to visit.    BP 90/60 mmHg  Pulse 77  Temp(Src) 98.2 F (36.8 C) (Oral)  Ht 5\' 4"  (1.626 m)  Wt 164 lb (74.39 kg)  BMI 28.14 kg/m2  SpO2 98%       Objective:   Physical Exam  General  Mental Status - Alert. General Appearance - Well groomed. Not in acute distress.  Skin Rashes- No Rashes.  HEENT Head- Normal. Ear Auditory Canal - Left- Normal. Right - Normal.Tympanic Membrane- Left- Normal. Right- Normal. Eye Sclera/Conjunctiva- Left- Normal. Right- Normal. Nose & Sinuses Nasal Mucosa- Left-  Boggy and Congested. Right-  Boggy and  Congested.Bilateral maxillary and frontal sinus pressure. Mouth & Throat Lips: Upper Lip- Normal: no dryness, cracking, pallor, cyanosis, or vesicular eruption. Lower Lip-Normal: no dryness, cracking, pallor, cyanosis or vesicular eruption. Buccal Mucosa- Bilateral- No Aphthous ulcers. Oropharynx- No Discharge or Erythema. Tonsils: Characteristics- Bilateral- No Erythema or Congestion. Size/Enlargement- Bilateral- No enlargement. Discharge- bilateral-None.  Neck Neck- Supple. No Masses.   Chest and Lung  Exam Auscultation: Breath Sounds:-Clear even and unlabored.  Cardiovascular Auscultation:Rythm- Regular, rate and rhythm. Murmurs & Other Heart Sounds:Ausculatation of the heart reveal- No Murmurs.  Lymphatic Head & Neck General Head & Neck Lymphatics: Bilateral: Description- No Localized lymphadenopathy.       Assessment & Plan:  You appear to have the flu even though your  rapid flu test was negative.(Important to note sometimes flu test can be falsely negative.)  Therefore,  I am treating you with tamiflu based on your clinical presentation. Rest, hydrate and take tylenol for fever. Alternate ibuprofen  if necessary for body ache or fever. You should gradually improve but  if you develop secondary bacterial infection signs and symptoms  please notify us so reevaluation or antibiotic can be prescribed.  For cough rx hycodan rx. For nasal congestion flonase.  Follow up in 7 days or as needed

## 2015-12-13 NOTE — Progress Notes (Signed)
Pre visit review using our clinic review tool, if applicable. No additional management support is needed unless otherwise documented below in the visit note. 

## 2015-12-13 NOTE — Patient Instructions (Signed)
You appear to have the flu even though your  rapid flu test was negative.(Important to note sometimes flu test can be falsely negative.) Therefore,  I am treating you with tamiflu based on your clinical presentation. Rest, hydrate and take tylenol for fever. Alternate ibuprofen  if necessary for body ache or fever. You should gradually improve but  if you develop secondary bacterial infection signs and symptoms  please notify us so reevaluation or antibiotic can be prescribed.  For cough rx hycodan rx. For nasal congestion flonase.  Follow up in 7 days or as needed

## 2015-12-16 ENCOUNTER — Ambulatory Visit (HOSPITAL_BASED_OUTPATIENT_CLINIC_OR_DEPARTMENT_OTHER)
Admission: RE | Admit: 2015-12-16 | Discharge: 2015-12-16 | Disposition: A | Discharge status: Home / Self Care | Payer: Managed Care, Other (non HMO) | Source: Ambulatory Visit | Attending: Medical | Admitting: Medical

## 2015-12-16 ENCOUNTER — Encounter: Payer: Self-pay | Admitting: Medical

## 2015-12-16 ENCOUNTER — Ambulatory Visit (INDEPENDENT_AMBULATORY_CARE_PROVIDER_SITE_OTHER): Payer: Managed Care, Other (non HMO) | Admitting: Medical

## 2015-12-16 VITALS — BP 98/64 | HR 71 | Temp 98.1°F | Ht 64.0 in | Wt 157.4 lb

## 2015-12-16 DIAGNOSIS — R059 Cough, unspecified: Secondary | ICD-10-CM

## 2015-12-16 DIAGNOSIS — J209 Acute bronchitis, unspecified: Secondary | ICD-10-CM

## 2015-12-16 DIAGNOSIS — R05 Cough: Secondary | ICD-10-CM | POA: Diagnosis present

## 2015-12-16 DIAGNOSIS — R112 Nausea with vomiting, unspecified: Secondary | ICD-10-CM

## 2015-12-16 LAB — POC URINALSYSI DIPSTICK (AUTOMATED)
BILIRUBIN UA: NEGATIVE
GLUCOSE UA: NEGATIVE
Leukocytes, UA: NEGATIVE
NITRITE UA: NEGATIVE
RBC UA: NEGATIVE
Spec Grav, UA: >=1.03
UROBILINOGEN UA: 0.2
pH, UA: 5

## 2015-12-16 LAB — POCT URINE PREGNANCY: PREG TEST UR: NEGATIVE

## 2015-12-16 MED ORDER — ONDANSETRON 8 MG PO TBDP: 8.0000 mg | ORAL_TABLET | Freq: Three times a day (TID) | ORAL | Status: DC | PRN

## 2015-12-16 MED ORDER — AZITHROMYCIN 250 MG PO TABS: ORAL_TABLET | ORAL | Status: DC

## 2015-12-16 MED ORDER — BENZONATATE 100 MG PO CAPS: 100.0000 mg | ORAL_CAPSULE | Freq: Three times a day (TID) | ORAL | Status: DC | PRN

## 2015-12-16 NOTE — Progress Notes (Signed)
Subjective:    Patient ID: Joanna Todd, female    DOB: [birth year], 30 y.o.   MRN: [id number]  HPI  Pt in states she still has runny nose and chest congestion. Bodyaches did resolved within a day.   Pt is still coughing. Bringing up mucous when she coughs. When I saw pt last time was dry cough.  Pt vomiting about one time a day since last saw her 3 days ago.  Decreased appetite and then states scared to eat because. Smells food and makes her nausea. Pt did take pregnancy test last Friday and was negative. Pt has her tubes tied. No adenexal pain or abdomen pain. No uti symptoms.  Pt has lost 6 pounds since I last saw her.     Review of Systems  Constitutional: Positive for fatigue. Negative for fever and chills.  HENT: Positive for congestion. Negative for ear pain, nosebleeds, postnasal drip, sinus pressure, sore throat and voice change.   Respiratory: Positive for cough. Negative for shortness of breath and wheezing.   Cardiovascular: Negative for chest pain and palpitations.  Gastrointestinal: Positive for nausea and vomiting. Negative for abdominal pain, diarrhea, constipation, blood in stool, anal bleeding and rectal pain.  Genitourinary: Negative for dysuria, urgency, frequency, flank pain, difficulty urinating and pelvic pain.  Musculoskeletal: Negative for myalgias and back pain.  Skin: Negative for rash.  Hematological: Negative for adenopathy. Does not bruise/bleed easily.  Psychiatric/Behavioral: Negative for behavioral problems and confusion.    Past Medical History  Diagnosis Date  . No pertinent past medical history      Social History   Social History  . Marital Status: Married    Spouse Name: N/A  . Number of Children: N/A  . Years of Education: N/A   Occupational History  . Not on file.   Social History Main Topics  . Smoking status: Current Every Day Smoker -- 0.50 packs/day  . Smokeless tobacco: Former Neurosurgeon    Quit date: 05/16/2011  . Alcohol  Use: No  . Drug Use: No  . Sexual Activity: Not on file   Other Topics Concern  . Not on file   Social History Narrative    Past Surgical History  Procedure Laterality Date  . Cesarean section    . No past surgeries      Family History  Problem Relation Age of Onset  . Hypertension Mother     No Known Allergies  Current Outpatient Prescriptions on File Prior to Visit  Medication Sig Dispense Refill  . clonazePAM (KLONOPIN) 0.5 MG tablet 1 tab po bid as needed severe anxiety or panic attacks. 6 tablet 0  . DULoxetine (CYMBALTA) 30 MG capsule Take 30 mg by mouth daily.    . fluticasone (FLONASE) 50 MCG/ACT nasal spray Place 2 sprays into both nostrils daily. 16 g 2  . lidocaine (XYLOCAINE) 2 % solution Use as directed 15 mLs in the mouth or throat as needed for mouth pain. Gargle and spit 20 mL 0  . oseltamivir (TAMIFLU) 75 MG capsule Take 1 capsule (75 mg total) by mouth 2 (two) times daily. 10 capsule 0   No current facility-administered medications on file prior to visit.    BP 98/64 mmHg  Pulse 71  Temp(Src) 98.1 F (36.7 C) (Oral)  Ht  (1.626 m)  Wt 157 lb 6.4 oz (71.396 kg)  BMI 27.00 kg/m2  SpO2 98%       Objective:   Physical Exam  General  Mental Status -  Alert. General Appearance - Well groomed. Not in acute distress.  Skin Rashes- No Rashes.  HEENT Head- Normal. Ear Auditory Canal - Left- Normal. Right - Normal.Tympanic Membrane- Left- Normal. Right- Normal. Eye Sclera/Conjunctiva- Left- Normal. Right- Normal. Nose & Sinuses Nasal Mucosa- Left-  Boggy and Congested. Right-  Boggy and  Congested.Bilateral maxillary and frontal sinus pressure. Mouth & Throat Lips: Upper Lip- Normal: no dryness, cracking, pallor, cyanosis, or vesicular eruption. Lower Lip-Normal: no dryness, cracking, pallor, cyanosis or vesicular eruption. Buccal Mucosa- Bilateral- No Aphthous ulcers. Oropharynx- No Discharge or Erythema. Tonsils: Characteristics-  Bilateral- No Erythema or Congestion. Size/Enlargement- Bilateral- No enlargement. Discharge- bilateral-None.  Neck Neck- Supple. No Masses.   Chest and Lung Exam Auscultation: Breath Sounds:-even, unlabored but mild rough breath sounds rt side. No wheezing  Cardiovascular Auscultation:Rythm- Regular, rate and rhythm. Murmurs & Other Heart Sounds:Ausculatation of the heart reveal- No Murmurs.  Lymphatic Head & Neck General Head & Neck Lymphatics: Bilateral: Description- No Localized lymphadenopathy.       Assessment & Plan:  You appear to have bronchitis or maybe pneumonia post flu.   I want to get cxr today and put you on antibiotic.  You may have hycodan at your pharmacy. You state you sister dropped that off for you. If you don't have that available then fill benzonatate.  For nausea and vomiting rx zofran.  You don't have any abdomen pain on exam. Over long holiday weekend if vomiting worsens despite zofran or if any abdomen pain then ED evaluation.  When you get zofran in your system start eating bland foods and hydrate with propel or gatorade.  Follow up in 7 days or as needed  Note after hours and unable to do any labs since lab closed.  Pregnancy test neg.  Pt states lost 6 lb. She had trace ketones. Her vitals were stable. I did not think ED evaluation. Was necessary. She looked stable. Thought she did not need IV hydration at this time. I think will do well with zofran.

## 2015-12-16 NOTE — Progress Notes (Signed)
Pre visit review using our clinic review tool, if applicable. No additional management support is needed unless otherwise documented below in the visit note. 

## 2015-12-16 NOTE — Patient Instructions (Addendum)
You appear to have bronchitis or maybe pneumonia post flu.   I want to get cxr today and put you on antibiotic.  You may have hycodan at your pharmacy. You state you sister dropped that off for you. If you don't have that available then fill benzonatate.  For nausea and vomiting rx zofran.  You don't have any abdomen pain on exam. Over long holiday weekend if vomiting worsens despite zofran or if any abdomen pain then ED evaluation.  When you get zofran in your system start eating bland foods and hydrate with propel or gatorade.  Follow up in 7 days or as needed

## 2016-12-29 ENCOUNTER — Telehealth: Payer: Self-pay | Admitting: Family

## 2016-12-29 ENCOUNTER — Encounter: Payer: Self-pay | Admitting: Family

## 2016-12-29 ENCOUNTER — Ambulatory Visit (INDEPENDENT_AMBULATORY_CARE_PROVIDER_SITE_OTHER): Payer: 59 | Admitting: Family

## 2016-12-29 VITALS — BP 110/50 | HR 50 | Temp 98.1°F | Resp 18 | Ht 64.0 in | Wt 165.4 lb

## 2016-12-29 DIAGNOSIS — L700 Acne vulgaris: Secondary | ICD-10-CM | POA: Diagnosis not present

## 2016-12-29 MED ORDER — MINOCYCLINE HCL 50 MG PO CAPS: 50.0000 mg | ORAL_CAPSULE | Freq: Two times a day (BID) | ORAL | 1 refills | Status: DC

## 2016-12-29 NOTE — Progress Notes (Signed)
Pre visit review using our clinic review tool, if applicable. No additional management support is needed unless otherwise documented below in the visit note. 

## 2016-12-29 NOTE — Patient Instructions (Addendum)
Begin minocycline twice daily for acne. Follow up with Dr. Laury Axon in 6 weeks.

## 2016-12-29 NOTE — Progress Notes (Signed)
   Subjective:    Patient ID: Joanna Todd, female    DOB: 1986/03/11, 31 y.o.   MRN: [id number]  HPI  Joanna Todd is a 31 yr old female who presents today to discuss acne. Reports hx of acne her who adult life. Feels like she does not want to go out of the house because she feels embarassed by her acne. Reports "I have no self confidence."  She reports that she has been to several dermatologists and has tried several different topical rx's and none have helped her.Reports sister is on doxycycline and it has helped her. She is interested in trying doxycyline.     Review of Systems See HPI  Past Medical History:  Diagnosis Date  . No pertinent past medical history      Social History   Social History  . Marital status: Married    Spouse name: N/A  . Number of children: N/A  . Years of education: N/A   Occupational History  . Not on file.   Social History Main Topics  . Smoking status: Current Every Day Smoker    Packs/day: 0.50  . Smokeless tobacco: Former Neurosurgeon    Quit date: 05/16/2011  . Alcohol use No  . Drug use: No  . Sexual activity: Not on file   Other Topics Concern  . Not on file   Social History Narrative  . No narrative on file    Past Surgical History:  Procedure Laterality Date  . CESAREAN SECTION    . NO PAST SURGERIES      Family History  Problem Relation Age of Onset  . Hypertension Mother     No Known Allergies  No current outpatient prescriptions on file prior to visit.   No current facility-administered medications on file prior to visit.     BP (!) 110/50 (BP Location: Right Arm, Cuff Size: Normal)   Pulse (!) 50   Temp 98.1 F (36.7 C) (Oral)   Resp 18   Ht  (1.626 m)   Wt 165 lb 6.4 oz (75 kg)   LMP 12/22/2016   SpO2 100% Comment: room air  BMI 28.39 kg/m       Objective:   Physical Exam  Constitutional: She appears well-developed and well-nourished.  Skin: Skin is warm and dry.  Moderate acne noted on  forehead and bilateral cheeks          Assessment & Plan:  Acne- uncontrolled. Rx with trial of minocycline  bid. . She is s/p tubal ligation. We discussed importance of covering up and wearing good sunscreen for sun exposure due to possible photosensitivity.

## 2016-12-29 NOTE — Telephone Encounter (Signed)
See my chart message

## 2017-01-09 ENCOUNTER — Ambulatory Visit: Payer: 59 | Admitting: Family Medicine

## 2017-02-09 ENCOUNTER — Ambulatory Visit: Payer: 59 | Admitting: Family Medicine

## 2017-03-26 IMAGING — DX DG CHEST 2V
2 series · 2 of 2 positions shown · non-contrast
Comparison: 01/19/2005

CLINICAL DATA: Cough and congestion

EXAM:
CHEST  2 VIEW

[chest pa]
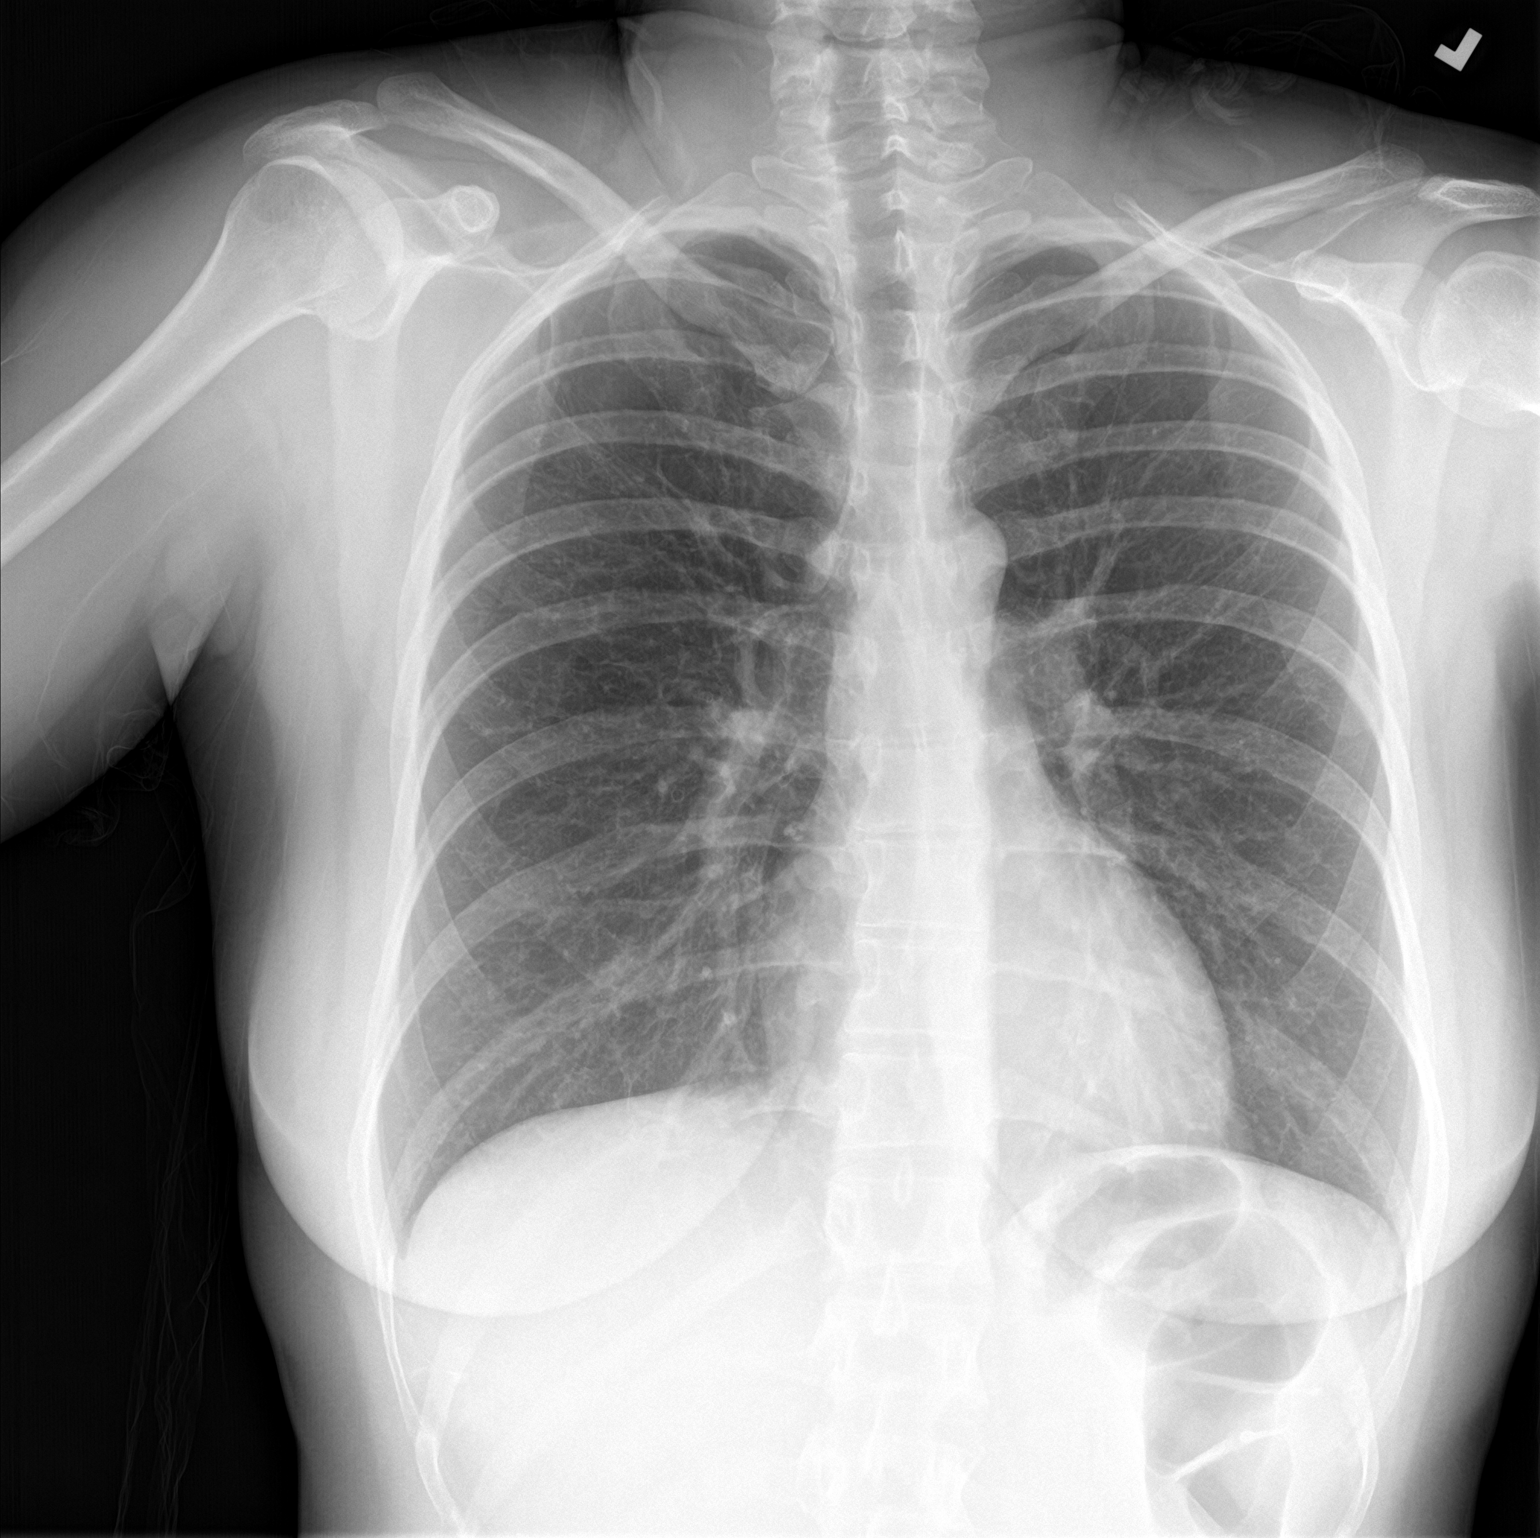

[chest lat]
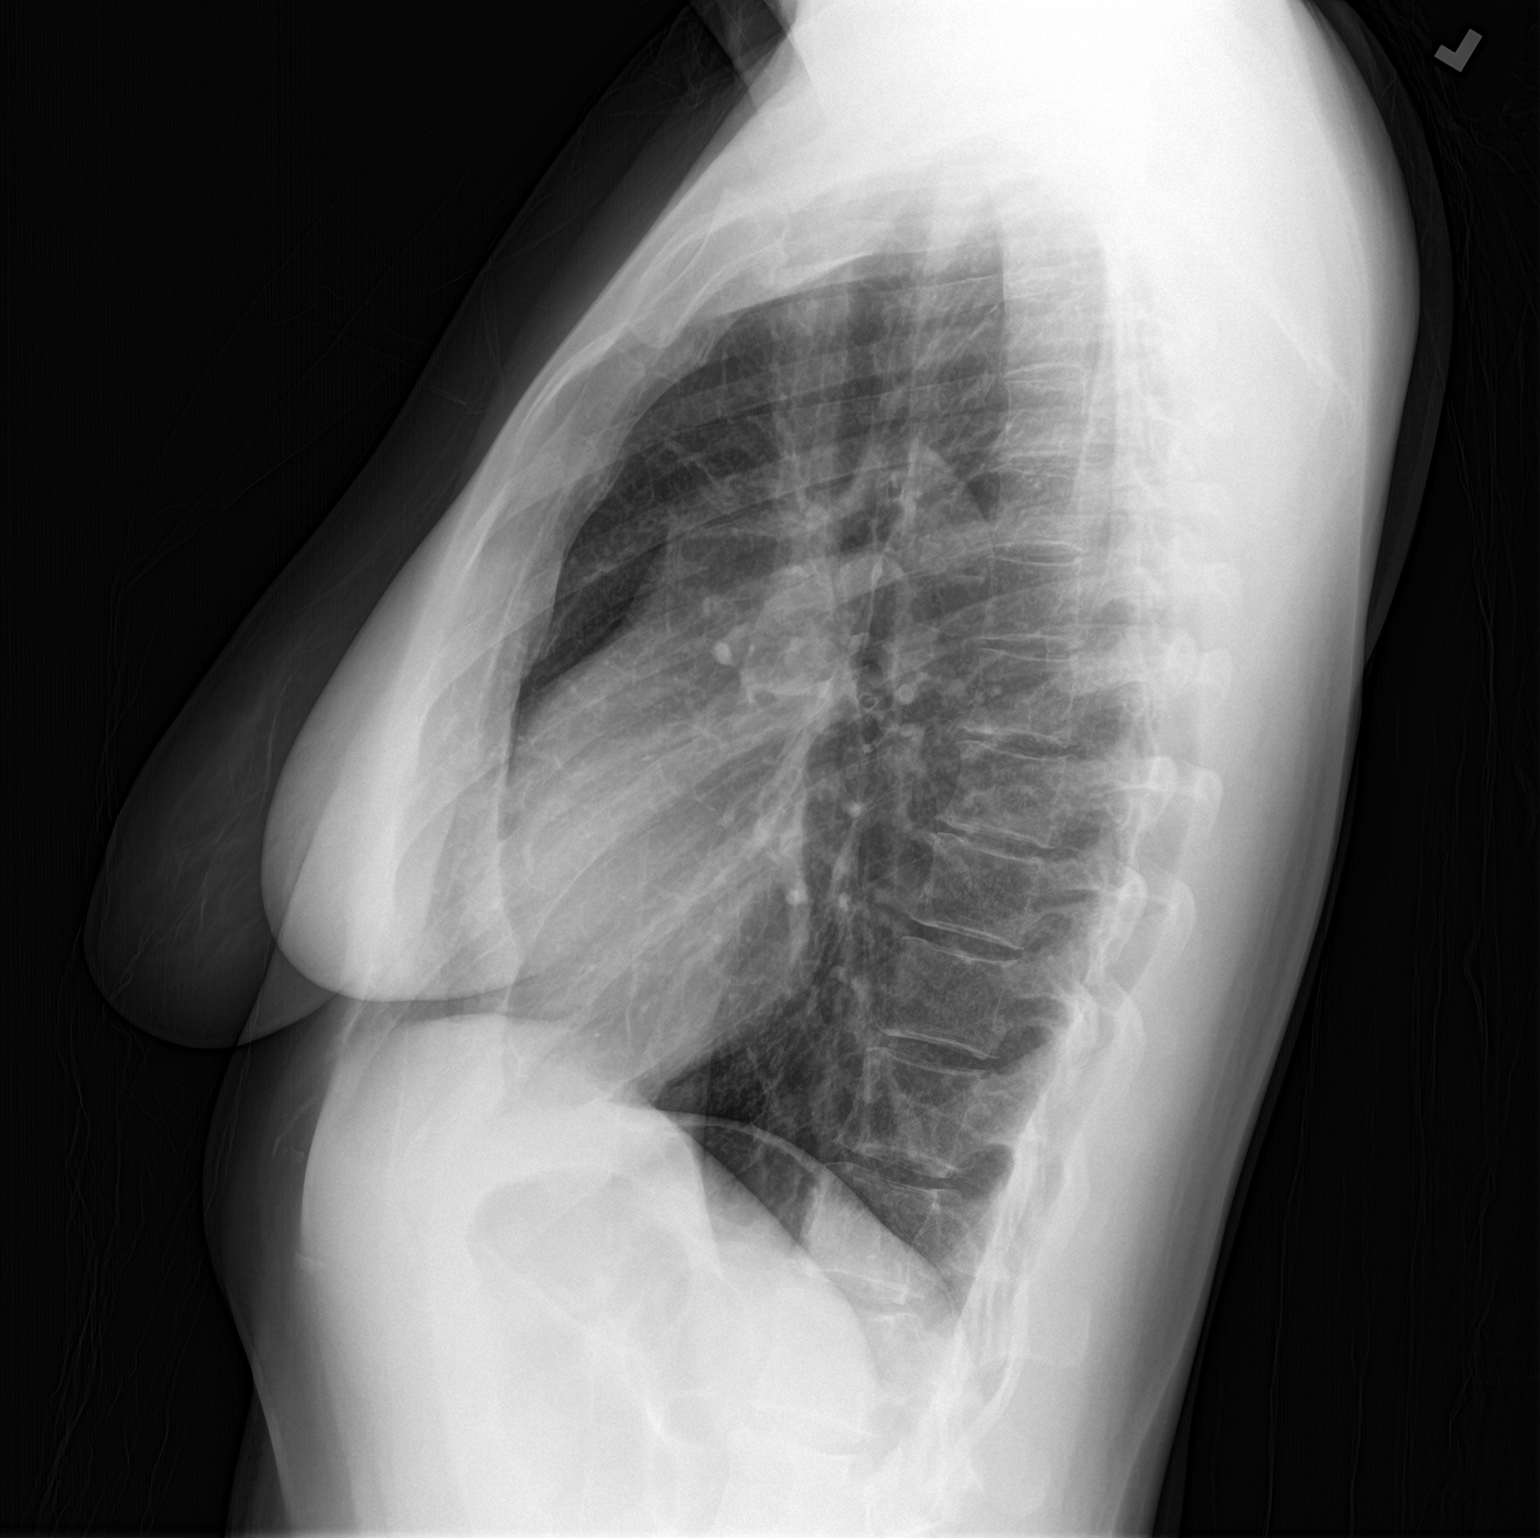

[2 of 2 positions shown; findings below may reference images not displayed]

FINDINGS: Cardiac shadow is within normal limits. The lungs are well aerated
bilaterally. No focal infiltrate or sizable effusion is seen. No
acute bony abnormality is noted.
IMPRESSION: No active cardiopulmonary disease.

## 2019-02-05 ENCOUNTER — Other Ambulatory Visit: Payer: Self-pay

## 2019-02-05 MED ORDER — ALPRAZOLAM 1 MG PO TABS: 1.0000 mg | ORAL_TABLET | Freq: Every evening | ORAL | 0 refills | Status: DC | PRN

## 2019-02-06 ENCOUNTER — Telehealth: Payer: Self-pay | Admitting: Psychiatry

## 2019-02-06 NOTE — Telephone Encounter (Signed)
Pt needs refill on xanax 1mg  sent to [facility] on [address], [place]. Pt made a appt for 02/13/19.

## 2019-02-06 NOTE — Telephone Encounter (Signed)
It was submitted yesterday

## 2019-02-13 ENCOUNTER — Other Ambulatory Visit: Payer: Self-pay

## 2019-02-13 ENCOUNTER — Encounter: Payer: Self-pay | Admitting: Psychiatry

## 2019-02-13 ENCOUNTER — Ambulatory Visit: Payer: BC Managed Care – PPO | Admitting: Psychiatry

## 2019-02-13 DIAGNOSIS — F330 Major depressive disorder, recurrent, mild: Secondary | ICD-10-CM

## 2019-02-13 DIAGNOSIS — F422 Mixed obsessional thoughts and acts: Secondary | ICD-10-CM | POA: Diagnosis not present

## 2019-02-13 MED ORDER — PROPRANOLOL HCL 10 MG PO TABS: ORAL_TABLET | ORAL | 1 refills | Status: DC

## 2019-02-13 MED ORDER — ALPRAZOLAM 1 MG PO TABS: 1.0000 mg | ORAL_TABLET | Freq: Two times a day (BID) | ORAL | 1 refills | Status: DC | PRN

## 2019-02-13 MED ORDER — DULOXETINE HCL 60 MG PO CPEP: 60.0000 mg | ORAL_CAPSULE | Freq: Every day | ORAL | 0 refills | Status: DC

## 2019-02-13 MED ORDER — GABAPENTIN 250 MG/5ML PO SOLN: 50.0000 mg | Freq: Every day | ORAL | 1 refills | Status: DC

## 2019-02-13 NOTE — Progress Notes (Signed)
Joanna BrineLisa L Burroughs [id number] 06/03/1986 33 y.o.  Subjective:   Patient ID:  Joanna Todd is a 33 y.o. (DOB 07/27/1986) female.  Chief Complaint:  Chief Complaint  Patient presents with  . Anxiety    HPI Joanna BrineLisa L Wengert presents to the office today for follow-up of anxiety and depression. She reports that her anxiety has been increased with the pandemic. Reports that she has had anxiety with leaving her house. Reports that she has been compulsively cleaning her house. Reports that she is also having some compulsions around disinfecting things. She reports anxious thoughts and "think something is going to happen." Reports fears of getting COVID. Reports that she is having "small panic attacks." Reports that her anxiety was manageable before COVID. She reports some recent depression- "there's noting to do, there's no where to go." Reports sad mood on a daily basis. Has had some irritability. Reports feelings of hopelessness.  Reports that she is getting an adequate amount of sleep but feels that sleep quality is poor. Reports that she feels tired during the day. Energy and motivation have been low. Appetite has been normal. Denies impaired concentration. Denies anhedonia. Denies SI.    She reports that she will sweat excessively with anxiety. She reports that she has been having to take Xanax 1 mg po BID prn anxiety. She reports that prior to the pandemic she was taking Xanax prn 1-2 times a week.   Has 3 children. Works doing Scientific laboratory technicianresidential cleaning.   Past Psychiatric Medication Trials: Cymbalta Sertraline- Sexual side effects. Effective Celexa- severe nausea Gabapentin Xanax Klonopin   Review of Systems:  Review of Systems  Musculoskeletal: Negative for gait problem.  Neurological: Negative for tremors.  Psychiatric/Behavioral:       Please refer to HPI    Medications: I have reviewed the patient's current medications.  Current Outpatient Medications  Medication Sig  Dispense Refill  . [START ON 02/20/2019] ALPRAZolam (XANAX) 1 MG tablet Take 1 tablet (1 mg total) by mouth 2 (two) times daily as needed for anxiety. 60 tablet 1  . DULoxetine (CYMBALTA) 60 MG capsule Take 1 capsule (60 mg total) by mouth daily. 90 capsule 0  . gabapentin (NEURONTIN) 250 MG/5ML solution Take 1 mL (50 mg total) by mouth at bedtime for 30 days. 30 mL 1  . propranolol (INDERAL) 10 MG tablet Take 1-2 tabs po BID prn anxiety 120 tablet 1   No current facility-administered medications for this visit.     Medication Side Effects: None  Allergies: No Known Allergies  Past Medical History:  Diagnosis Date  . No pertinent past medical history     Family History  Problem Relation Age of Onset  . Hypertension Mother   . Anxiety disorder Mother   . Anxiety disorder Sister   . Anxiety disorder Maternal Aunt   . Anxiety disorder Maternal Grandmother     Social History   Socioeconomic History  . Marital status: Married    Spouse name: Not on file  . Number of children: Not on file  . Years of education: Not on file  . Highest education level: Not on file  Occupational History  . Not on file  Social Needs  . Financial resource strain: Not on file  . Food insecurity    Worry: Not on file    Inability: Not on file  . Transportation needs    Medical: Not on file    Non-medical: Not on file  Tobacco Use  . Smoking status: Current  Every Day Smoker    Packs/day: 0.50  . Smokeless tobacco: Never Used  Substance and Sexual Activity  . Alcohol use: No  . Drug use: No  . Sexual activity: Not on file  Lifestyle  . Physical activity    Days per week: Not on file    Minutes per session: Not on file  . Stress: Not on file  Relationships  . Social Herbalist on phone: Not on file    Gets together: Not on file    Attends religious service: Not on file    Active member of club or organization: Not on file    Attends meetings of clubs or organizations: Not on  file    Relationship status: Not on file  . Intimate partner violence    Fear of current or ex partner: Not on file    Emotionally abused: Not on file    Physically abused: Not on file    Forced sexual activity: Not on file  Other Topics Concern  . Not on file  Social History Narrative  . Not on file    Past Medical History, Surgical history, Social history, and Family history were reviewed and updated as appropriate.   Please see review of systems for further details on the patient's review from today.   Objective:   Physical Exam:  There were no vitals taken for this visit.  Physical Exam Constitutional:      General: She is not in acute distress.    Appearance: She is well-developed.  Musculoskeletal:        General: No deformity.  Neurological:     Mental Status: She is alert and oriented to person, place, and time.     Coordination: Coordination normal.  Psychiatric:        Attention and Perception: Attention and perception normal. She does not perceive auditory or visual hallucinations.        Mood and Affect: Mood is anxious and depressed. Affect is not labile, blunt, angry or inappropriate.        Speech: Speech normal.        Behavior: Behavior normal.        Thought Content: Thought content normal. Thought content does not include homicidal or suicidal ideation. Thought content does not include homicidal or suicidal plan.        Cognition and Memory: Cognition and memory normal.        Judgment: Judgment normal.     Comments: Insight intact. No delusions.  Pt presents as tense and restless on exam.     Lab Review:     Component Value Date/Time   NA 139 11/13/2013 1512   K 3.7 11/13/2013 1512   CL 105 11/13/2013 1512   CO2 25 11/13/2013 1512   GLUCOSE 72 11/13/2013 1512   BUN 7 11/13/2013 1512   CREATININE 0.7 11/13/2013 1512   CALCIUM 9.2 11/13/2013 1512   PROT 7.3 11/13/2013 1512   ALBUMIN 4.4 11/13/2013 1512   AST 20 11/13/2013 1512   ALT 14  11/13/2013 1512   ALKPHOS 48 11/13/2013 1512   BILITOT 0.6 11/13/2013 1512       Component Value Date/Time   WBC 11.7 (H) 11/13/2013 1512   RBC 4.41 11/13/2013 1512   HGB 12.9 11/13/2013 1512   HCT 38.2 11/13/2013 1512   PLT 259.0 11/13/2013 1512   MCV 86.7 11/13/2013 1512   MCH 30.1 05/17/2011 2205   MCHC 33.8 11/13/2013 1512   RDW  13.0 11/13/2013 1512   LYMPHSABS 2.7 11/13/2013 1512   MONOABS 0.3 11/13/2013 1512   EOSABS 0.1 11/13/2013 1512   BASOSABS 0.0 11/13/2013 1512    No results found for: POCLITH, LITHIUM   No results found for: PHENYTOIN, PHENOBARB, VALPROATE, CBMZ   .res Assessment: Plan:   Patient seen for 30 minutes and greater than 50% of visit spent counseling patient regarding treatment plan options for management of anxiety.  Discussed potential benefits, risks, and side effects of BuSpar and propanolol.  Patient reports that she would prefer not to add a regular daily medication.  Discussed plan to use lowest possible effective dose of Xanax to minimize risk of dependence and tolerance.  Discussed using propanolol as needed to possibly help decrease frequency of Xanax usage.  Patient agrees to trial of propanolol.  Will start propanolol 10 mg 1-2 tabs p.o. twice daily as needed anxiety.  Will increase Xanax to 1 mg p.o. twice daily as needed anxiety.  Also discussed patient's concerns about possible withdrawal from gabapentin and reporting that she has withdrawal signs and symptoms after stopping 100 mg nightly.  Discussed that gabapentin could be changed to oral solution form in order to decrease to lower dose since 100 mg is the smallest dose available in capsule form.  Discussed lowering gabapentin oral solution to 75 to 50 mg at bedtime and continuing to decrease as tolerated. Continue Cymbalta 60 mg daily for anxiety and depression. Patient to follow-up with this provider in 4 weeks or sooner if clinically indicated. Patient advised to contact office with any  questions, adverse effects, or acute worsening in signs and symptoms. Misty StanleyLisa was seen today for anxiety.  Diagnoses and all orders for this visit:  Mixed obsessional thoughts and acts -     gabapentin (NEURONTIN) 250 MG/5ML solution; Take 1 mL (50 mg total) by mouth at bedtime for 30 days. -     propranolol (INDERAL) 10 MG tablet; Take 1-2 tabs po BID prn anxiety -     DULoxetine (CYMBALTA) 60 MG capsule; Take 1 capsule (60 mg total) by mouth daily. -     ALPRAZolam (XANAX) 1 MG tablet; Take 1 tablet (1 mg total) by mouth 2 (two) times daily as needed for anxiety.  Mild episode of recurrent major depressive disorder (HCC) -     DULoxetine (CYMBALTA) 60 MG capsule; Take 1 capsule (60 mg total) by mouth daily.   Please see After Visit Summary for patient specific instructions.  Future Appointments  Date Time Provider Department Center  03/14/2019 10:30 AM Corie Chiquitoarter, Kaylem Gidney, PMHNP [facility] None    No orders of the defined types were placed in this encounter.     -------------------------------

## 2019-02-13 NOTE — Assessment & Plan Note (Signed)
Continue Cymbalta 60 mg po qd

## 2019-03-11 ENCOUNTER — Ambulatory Visit
Admission: EM | Admit: 2019-03-11 | Discharge: 2019-03-11 | Disposition: A | Discharge status: Home / Self Care | Payer: BC Managed Care – PPO | Attending: Physician Assistant | Admitting: Physician Assistant

## 2019-03-11 ENCOUNTER — Other Ambulatory Visit: Payer: Self-pay

## 2019-03-11 DIAGNOSIS — R6884 Jaw pain: Secondary | ICD-10-CM | POA: Diagnosis not present

## 2019-03-11 MED ORDER — MELOXICAM 7.5 MG PO TABS: 7.5000 mg | ORAL_TABLET | Freq: Every day | ORAL | 0 refills | Status: DC

## 2019-03-11 NOTE — Discharge Instructions (Addendum)
Start Mobic. Do not take ibuprofen (motrin/advil)/ naproxen (aleve) while on mobic. Ice compress. Avoid hard foods at this time. Follow up with ENT for further evaluation if symptoms not improving.

## 2019-03-11 NOTE — ED Provider Notes (Signed)
[name]    CSN: [id number] Arrival date & time: 03/11/19  1816      History   Chief Complaint Chief Complaint  Patient presents with  . Jaw Pain    HPI Joanna Todd is a 33 y.o. female.   33 year old female comes in for 1 week history of left lower jaw pain. States painful yawning and chewing. Denies pain with talking. Denies injury/trauma. Denies dental pain, oral swelling, trouble breathing, tripoding, drooling, trismus. Denies fever, chills, night sweats. She is unsure if there is swelling. Denies worsening pain or swelling with salivation. Denies history of salivary stone. Has not taken anything for the symptoms.      Past Medical History:  Diagnosis Date  . No pertinent past medical history     Patient Active Problem List   Diagnosis Date Noted  . Mixed obsessional thoughts and acts 02/13/2019  . Mild episode of recurrent major depressive disorder ([place]) 02/13/2019  . Allergic rhinitis 11/20/2014  . Acute bronchitis 11/20/2014  . Costochondritis 11/20/2014  . Adjustment disorder with mixed anxiety and depressed mood 10/01/2014  . Status post repeat low transverse cesarean section 05/15/2011    Past Surgical History:  Procedure Laterality Date  . CESAREAN SECTION    . NO PAST SURGERIES      OB History    Gravida  3   Para  3   Term  3   Preterm  0   AB  0   Living  3     SAB  0   TAB  0   Ectopic  0   Multiple  0   Live Births  1            Home Medications    Prior to Admission medications   Medication Sig Start Date End Date Taking? Authorizing Provider  ALPRAZolam Duanne Moron) 1 MG tablet Take 1 tablet (1 mg total) by mouth 2 (two) times daily as needed for anxiety. 02/20/19   Thayer Headings, PMHNP  DULoxetine (CYMBALTA) 60 MG capsule Take 1 capsule (60 mg total) by mouth daily. 02/13/19 05/14/19  Thayer Headings, PMHNP  gabapentin (NEURONTIN) 250 MG/5ML solution Take 1 mL (50 mg total) by mouth at bedtime for 30  days. 02/13/19 03/15/19  Thayer Headings, PMHNP  meloxicam (MOBIC) 7.5 MG tablet Take 1 tablet (7.5 mg total) by mouth daily. 03/11/19   Ok Edwards, PA-C    Family History Family History  Problem Relation Age of Onset  . Hypertension Mother   . Anxiety disorder Mother   . Anxiety disorder Sister   . Anxiety disorder Maternal Aunt   . Anxiety disorder Maternal Grandmother     Social History Social History   Tobacco Use  . Smoking status: Current Every Day Smoker    Packs/day: 0.50  . Smokeless tobacco: Never Used  Substance Use Topics  . Alcohol use: No  . Drug use: No     Allergies   Patient has no known allergies.   Review of Systems Review of Systems  Reason unable to perform ROS: See HPI as above.     Physical Exam Triage Vital Signs ED Triage Vitals [03/11/19 1824]  Enc Vitals Group     BP 100/67     Pulse Rate (!) 51     Resp 18     Temp 98.3 F (36.8 C)     Temp Source Oral     SpO2 96 %     Weight  Height      Head Circumference      Peak Flow      Pain Score 5     Pain Loc      Pain Edu?      Excl. in [place]?    No data found.  Updated Vital Signs BP 100/67 (BP Location: Left Arm)   Pulse (!) 51   Temp 98.3 F (36.8 C) (Oral)   Resp 18   LMP 03/11/2019   SpO2 96%   Physical Exam Constitutional:      General: She is not in acute distress.    Appearance: She is well-developed. She is not ill-appearing, toxic-appearing or diaphoretic.  HENT:     Head: Normocephalic and atraumatic.     Salivary Glands: Right salivary gland is not diffusely enlarged or tender. Left salivary gland is not diffusely enlarged or tender.     Comments: Clicking to the TMJ with jaw movement. No swelling, erythema, warmth. No tenderness to palpation. No painful movement. No trismus.     Mouth/Throat:     Mouth: Mucous membranes are moist.     Pharynx: Oropharynx is clear. Uvula midline. No uvula swelling.     Tonsils: No tonsillar exudate.     Comments: No  tenderness to palpation of the gum/teeth. No obvious cavity.   Floor of mouth soft to palpation. No facial swelling.  Eyes:     Conjunctiva/sclera: Conjunctivae normal.     Pupils: Pupils are equal, round, and reactive to light.  Neck:     Musculoskeletal: Normal range of motion and neck supple.  Skin:    General: Skin is warm and dry.  Neurological:     Mental Status: She is alert and oriented to person, place, and time.      UC Treatments / Results  Labs (all labs ordered are listed, but only abnormal results are displayed) Labs Reviewed - No data to display  EKG   Radiology No results found.  Procedures Procedures (including critical care time)  Medications Ordered in UC Medications - No data to display  Initial Impression / Assessment and Plan / UC Course  I have reviewed the triage vital signs and the nursing notes.  Pertinent labs & imaging results that were available during my care of the patient were reviewed by me and considered in my medical decision making (see chart for details).    Discussed possible TMD causing symptoms. Will provide mobic. Ice compress, rest. Will have patient follow up with dentist/ENT for further evaluation if symptoms not improving.  Final Clinical Impressions(s) / UC Diagnoses   Final diagnoses:  Jaw pain   ED Prescriptions    Medication Sig Dispense Auth. Provider   meloxicam (MOBIC) 7.5 MG tablet Take 1 tablet (7.5 mg total) by mouth daily. 14 tablet Threasa AlphaYu, Isack Lavalley V, PA-C        Niyanna Asch V, [place] 03/11/19 [phone]

## 2019-03-11 NOTE — ED Triage Notes (Signed)
Pt c/o lt lower jaw pain with swelling x1wk, painful to yawn and bite down

## 2019-03-14 ENCOUNTER — Ambulatory Visit: Payer: BC Managed Care – PPO | Admitting: Psychiatry

## 2019-03-17 ENCOUNTER — Other Ambulatory Visit: Payer: Self-pay

## 2019-03-18 ENCOUNTER — Encounter: Payer: Self-pay | Admitting: Family Medicine

## 2019-03-18 ENCOUNTER — Ambulatory Visit (INDEPENDENT_AMBULATORY_CARE_PROVIDER_SITE_OTHER): Payer: BC Managed Care – PPO | Admitting: Family Medicine

## 2019-03-18 VITALS — BP 97/53 | HR 64 | Temp 98.6°F | Resp 18 | Ht 64.0 in | Wt 172.0 lb

## 2019-03-18 DIAGNOSIS — R350 Frequency of micturition: Secondary | ICD-10-CM

## 2019-03-18 DIAGNOSIS — R319 Hematuria, unspecified: Secondary | ICD-10-CM | POA: Diagnosis not present

## 2019-03-18 DIAGNOSIS — N390 Urinary tract infection, site not specified: Secondary | ICD-10-CM

## 2019-03-18 DIAGNOSIS — R6884 Jaw pain: Secondary | ICD-10-CM | POA: Diagnosis not present

## 2019-03-18 LAB — POCT URINALYSIS DIP (MANUAL ENTRY)
Glucose, UA: NEGATIVE mg/dL
Leukocytes, UA: NEGATIVE
Nitrite, UA: NEGATIVE
Protein Ur, POC: 30 mg/dL — AB
Spec Grav, UA: >=1.03 — AB (ref 1.010–1.025)
Urobilinogen, UA: 0.2 E.U./dL
pH, UA: 6 (ref 5.0–8.0)

## 2019-03-18 MED ORDER — AMOXICILLIN-POT CLAVULANATE 875-125 MG PO TABS: 1.0000 | ORAL_TABLET | Freq: Two times a day (BID) | ORAL | 0 refills | Status: DC

## 2019-03-18 NOTE — Progress Notes (Signed)
Patient ID: Joanna Todd, female    DOB: 12/28/1985  Age: 33 y.o. MRN: [id number]    Subjective:  Subjective  HPI ENEIDA EVERS presents for pain L jaw -- pain with biting down  She went to UC and dentist --- no abscess.  + cavities but not causing pain.  There is a bulge L side  With biting down --- pain x few weeks  She also c/o dysuria that last few days-- no fever   Review of Systems  Constitutional: Negative for appetite change, diaphoresis, fatigue and unexpected weight change.  Eyes: Negative for pain, redness and visual disturbance.  Respiratory: Negative for cough, chest tightness, shortness of breath and wheezing.   Cardiovascular: Negative for chest pain, palpitations and leg swelling.  Endocrine: Negative for cold intolerance, heat intolerance, polydipsia, polyphagia and polyuria.  Genitourinary: Negative for difficulty urinating, dysuria and frequency.  Neurological: Negative for dizziness, light-headedness, numbness and headaches.    History Past Medical History:  Diagnosis Date  . No pertinent past medical history     She has a past surgical history that includes Cesarean section and No past surgeries.   Her family history includes Anxiety disorder in her maternal aunt, maternal grandmother, mother, and sister; Hypertension in her mother.She reports that she has been smoking. She has been smoking about 0.50 packs per day. She has never used smokeless tobacco. She reports that she does not drink alcohol or use drugs.  Current Outpatient Medications on File Prior to Visit  Medication Sig Dispense Refill  . ALPRAZolam (XANAX) 1 MG tablet Take 1 tablet (1 mg total) by mouth 2 (two) times daily as needed for anxiety. 60 tablet 1  . DULoxetine (CYMBALTA) 60 MG capsule Take 1 capsule (60 mg total) by mouth daily. 90 capsule 0  . meloxicam (MOBIC) 7.5 MG tablet Take 1 tablet (7.5 mg total) by mouth daily. 14 tablet 0  . gabapentin (NEURONTIN) 250 MG/5ML solution Take  1 mL (50 mg total) by mouth at bedtime for 30 days. 30 mL 1   No current facility-administered medications on file prior to visit.      Objective:  Objective  Physical Exam Vitals signs and nursing note reviewed.  Constitutional:      Appearance: She is well-developed.  HENT:     Head: Normocephalic and atraumatic.     Jaw: Swelling present.   Eyes:     Conjunctiva/sclera: Conjunctivae normal.  Neck:     Musculoskeletal: Normal range of motion and neck supple.     Thyroid: No thyromegaly.     Vascular: No carotid bruit or JVD.  Cardiovascular:     Rate and Rhythm: Normal rate and regular rhythm.     Heart sounds: Normal heart sounds. No murmur.  Pulmonary:     Effort: Pulmonary effort is normal. No respiratory distress.     Breath sounds: Normal breath sounds. No wheezing or rales.  Chest:     Chest wall: No tenderness.  Neurological:     Mental Status: She is alert and oriented to person, place, and time.    BP (!) 97/53 (BP Location: Left Arm, Patient Position: Sitting, Cuff Size: Normal)   Pulse 64   Temp 98.6 F (37 C) (Oral)   Resp 18   Ht 5\' 4"  (1.626 m)   Wt 172 lb (78 kg)   LMP 03/11/2019   SpO2 99%   BMI 29.52 kg/m  Wt Readings from Last 3 Encounters:  03/18/19 172 lb (78 kg)  12/29/16 165 lb 6.4 oz (75 kg)  12/16/15 157 lb 6.4 oz (71.4 kg)     Lab Results  Component Value Date   WBC 11.7 (H) 11/13/2013   HGB 12.9 11/13/2013   HCT 38.2 11/13/2013   PLT 259.0 11/13/2013   GLUCOSE 72 11/13/2013   ALT 14 11/13/2013   AST 20 11/13/2013   NA 139 11/13/2013   K 3.7 11/13/2013   CL 105 11/13/2013   CREATININE 0.7 11/13/2013   BUN 7 11/13/2013   CO2 25 11/13/2013   TSH 0.77 11/13/2013    No results found.   Assessment & Plan:  Plan  I am having Svea L. Gingrich start on amoxicillin-clavulanate. I am also having her maintain her gabapentin, DULoxetine, ALPRAZolam, and meloxicam.  Meds ordered this encounter  Medications  .  amoxicillin-clavulanate (AUGMENTIN) 875-125 MG tablet    Sig: Take 1 tablet by mouth 2 (two) times daily.    Dispense:  20 tablet    Refill:  0    Problem List Items Addressed This Visit      Unprioritized   UTI (urinary tract infection)   Relevant Medications   amoxicillin-clavulanate (AUGMENTIN) 875-125 MG tablet    Other Visit Diagnoses    Urinary frequency    -  Primary   Relevant Orders   POCT urinalysis dipstick (Completed)   Jaw pain, non-TMJ       Relevant Medications   amoxicillin-clavulanate (AUGMENTIN) 875-125 MG tablet   Other Relevant Orders   Ambulatory referral to ENT      Follow-up: Return if symptoms worsen or fail to improve.  Donato SchultzYvonne R Lowne Chase, DO

## 2019-03-18 NOTE — Patient Instructions (Signed)

## 2019-07-06 ENCOUNTER — Other Ambulatory Visit: Payer: Self-pay | Admitting: Psychiatry

## 2019-07-06 DIAGNOSIS — F330 Major depressive disorder, recurrent, mild: Secondary | ICD-10-CM

## 2019-07-06 DIAGNOSIS — F422 Mixed obsessional thoughts and acts: Secondary | ICD-10-CM

## 2019-07-07 ENCOUNTER — Other Ambulatory Visit: Payer: Self-pay

## 2019-07-07 ENCOUNTER — Other Ambulatory Visit: Payer: Self-pay | Admitting: Psychiatry

## 2019-07-07 ENCOUNTER — Telehealth: Payer: Self-pay | Admitting: Psychiatry

## 2019-07-07 DIAGNOSIS — F330 Major depressive disorder, recurrent, mild: Secondary | ICD-10-CM

## 2019-07-07 DIAGNOSIS — F422 Mixed obsessional thoughts and acts: Secondary | ICD-10-CM

## 2019-07-07 MED ORDER — DULOXETINE HCL 60 MG PO CPEP: 60.0000 mg | ORAL_CAPSULE | Freq: Every day | ORAL | 0 refills | Status: DC

## 2019-07-07 MED ORDER — ALPRAZOLAM 1 MG PO TABS: 1.0000 mg | ORAL_TABLET | Freq: Two times a day (BID) | ORAL | 0 refills | Status: DC | PRN

## 2019-07-07 NOTE — Telephone Encounter (Signed)
Pt request refill for Cymbalta. Will be out today. Pharmacy should have sent request. [facility]. Also request Xanax refill. Appt 11/17

## 2019-07-07 NOTE — Telephone Encounter (Signed)
Last refill Xanax 04/30/2019 #60 refills submitted

## 2019-07-22 ENCOUNTER — Ambulatory Visit: Payer: BC Managed Care – PPO | Admitting: Psychiatry

## 2019-10-08 ENCOUNTER — Ambulatory Visit
Admission: EM | Admit: 2019-10-08 | Discharge: 2019-10-08 | Disposition: A | Discharge status: Home / Self Care | Payer: BC Managed Care – PPO | Attending: Emergency Medicine | Admitting: Emergency Medicine

## 2019-10-08 DIAGNOSIS — N3001 Acute cystitis with hematuria: Secondary | ICD-10-CM | POA: Diagnosis not present

## 2019-10-08 DIAGNOSIS — B9689 Other specified bacterial agents as the cause of diseases classified elsewhere: Secondary | ICD-10-CM | POA: Diagnosis not present

## 2019-10-08 DIAGNOSIS — Z3202 Encounter for pregnancy test, result negative: Secondary | ICD-10-CM

## 2019-10-08 LAB — POCT URINALYSIS DIP (MANUAL ENTRY)
Bilirubin, UA: NEGATIVE
Glucose, UA: 100 mg/dL — AB
Nitrite, UA: POSITIVE — AB
Protein Ur, POC: 30 mg/dL — AB
Spec Grav, UA: >=1.03 — AB (ref 1.010–1.025)
Urobilinogen, UA: 1 E.U./dL
pH, UA: 5 (ref 5.0–8.0)

## 2019-10-08 LAB — POCT URINE PREGNANCY: Preg Test, Ur: NEGATIVE

## 2019-10-08 MED ORDER — CEPHALEXIN 500 MG PO CAPS: 500.0000 mg | ORAL_CAPSULE | Freq: Two times a day (BID) | ORAL | 0 refills | Status: AC

## 2019-10-08 NOTE — ED Provider Notes (Signed)
[name]    CSN: [id number] Arrival date & time: 10/08/19  1711      History   Chief Complaint Chief Complaint  Patient presents with  . Urinary Tract Infection    HPI Joanna Todd is a 34 y.o. female presenting for burning with urination, urinary urgency and frequency for the last 2 weeks.  Patient endorsing history of UTI: States this feels similar.  Denies history of renal calculi, pyelonephritis.  No fever, abdominal or back pain, pelvic pain, vaginal discharge.  Patient has tried Azo with some relief of symptoms.   Past Medical History:  Diagnosis Date  . No pertinent past medical history     Patient Active Problem List   Diagnosis Date Noted  . UTI (urinary tract infection) 03/18/2019  . Mixed obsessional thoughts and acts 02/13/2019  . Mild episode of recurrent major depressive disorder ([facility]) 02/13/2019  . Allergic rhinitis 11/20/2014  . Acute bronchitis 11/20/2014  . Costochondritis 11/20/2014  . Adjustment disorder with mixed anxiety and depressed mood 10/01/2014  . Status post repeat low transverse cesarean section 05/15/2011    Past Surgical History:  Procedure Laterality Date  . CESAREAN SECTION    . NO PAST SURGERIES      OB History    Gravida  3   Para  3   Term  3   Preterm  0   AB  0   Living  3     SAB  0   TAB  0   Ectopic  0   Multiple  0   Live Births  1            Home Medications    Prior to Admission medications   Medication Sig Start Date End Date Taking? Authorizing Provider  DULoxetine (CYMBALTA) 60 MG capsule Take 1 capsule (60 mg total) by mouth daily. 07/07/19  Yes Corie Chiquito, PMHNP  ALPRAZolam Prudy Feeler) 1 MG tablet Take 1 tablet (1 mg total) by mouth 2 (two) times daily as needed for anxiety. 07/07/19   Corie Chiquito, PMHNP  cephALEXin (KEFLEX) 500 MG capsule Take 1 capsule (500 mg total) by mouth 2 (two) times daily for 5 days. 10/08/19 10/13/19  Hall-Potvin, [place], PA-C  gabapentin  (NEURONTIN) 250 MG/5ML solution Take 1 mL (50 mg total) by mouth at bedtime for 30 days. 02/13/19 03/15/19  Corie Chiquito, PMHNP    Family History Family History  Problem Relation Age of Onset  . Hypertension Mother   . Anxiety disorder Mother   . Anxiety disorder Sister   . Anxiety disorder Maternal Aunt   . Anxiety disorder Maternal Grandmother     Social History Social History   Tobacco Use  . Smoking status: Current Every Day Smoker    Packs/day: 0.50  . Smokeless tobacco: Never Used  Substance Use Topics  . Alcohol use: No  . Drug use: No     Allergies   Patient has no known allergies.   Review of Systems As per HPI   Physical Exam Triage Vital Signs ED Triage Vitals  Enc Vitals Group     BP      Pulse      Resp      Temp      Temp src      SpO2      Weight      Height      Head Circumference      Peak Flow      Pain Score  Pain Loc      Pain Edu?      Excl. in [place]?    No data found.  Updated Vital Signs BP (!) 106/53 (BP Location: Left Arm)   Pulse (!) 54   Temp 98.8 F (37.1 C) (Oral)   Resp 16   LMP 10/01/2019   SpO2 95%   Visual Acuity Right Eye Distance:   Left Eye Distance:   Bilateral Distance:    Right Eye Near:   Left Eye Near:    Bilateral Near:     Physical Exam Constitutional:      General: She is not in acute distress. HENT:     Head: Normocephalic and atraumatic.  Eyes:     General: No scleral icterus.    Pupils: Pupils are equal, round, and reactive to light.  Cardiovascular:     Rate and Rhythm: Normal rate.  Pulmonary:     Effort: Pulmonary effort is normal.  Abdominal:     General: Bowel sounds are normal.     Palpations: Abdomen is soft.     Tenderness: There is no abdominal tenderness. There is no right CVA tenderness, left CVA tenderness or guarding.  Skin:    Coloration: Skin is not jaundiced or pale.  Neurological:     Mental Status: She is alert and oriented to person, place, and time.       UC Treatments / Results  Labs (all labs ordered are listed, but only abnormal results are displayed) Labs Reviewed  POCT URINALYSIS DIP (MANUAL ENTRY) - Abnormal; Notable for the following components:      Result Value   Glucose, UA =100 (*)    Ketones, POC UA trace (5) (*)    Spec Grav, UA >=1.030 (*)    Blood, UA small (*)    Protein Ur, POC =30 (*)    Nitrite, UA Positive (*)    Leukocytes, UA Large (3+) (*)    All other components within normal limits  POCT URINE PREGNANCY - Normal  URINE CULTURE    EKG   Radiology No results found.  Procedures Procedures (including critical care time)  Medications Ordered in UC Medications - No data to display  Initial Impression / Assessment and Plan / UC Course  I have reviewed the triage vital signs and the nursing notes.  Pertinent labs & imaging results that were available during my care of the patient were reviewed by me and considered in my medical decision making (see chart for details).     Patient afebrile, nontoxic in office today.  Patient is bradycardic with low blood pressure, though this is chronic/stable for her.  Urine pregnancy negative, urine dipstick showing small blood, large leukocytes with positive nitrates, protein, trace ketones.  Patient is currently taking Azo which could alter results.  Will start Keflex for suspected UTI.  Culture pending.  Reviewed findings with patient who verbalized understanding.  May continue Azo as needed for symptom relief.  Return precautions discussed, patient verbalized understanding and is agreeable to plan. Final Clinical Impressions(s) / UC Diagnoses   Final diagnoses:  Acute cystitis with hematuria     Discharge Instructions     Take antibiotic as prescribed. Urine culture pending-we will call you if we need to change antibiotic, otherwise please complete course. Please return for worsening pain, blood in urine, abdominal or back pain, fever.    ED  Prescriptions    Medication Sig Dispense Auth. Provider   cephALEXin (KEFLEX) 500 MG capsule Take 1  capsule (500 mg total) by mouth 2 (two) times daily for 5 days. 10 capsule Hall-Potvin, [place], PA-C     PDMP not reviewed this encounter.   Odette Fraction [place], [place] 10/08/19 1930

## 2019-10-08 NOTE — Discharge Instructions (Signed)
Take antibiotic as prescribed. Urine culture pending-we will call you if we need to change antibiotic, otherwise please complete course. Please return for worsening pain, blood in urine, abdominal or back pain, fever.

## 2019-10-08 NOTE — ED Triage Notes (Signed)
Pt c/o burning on urination, urgency and frequency x 2wks. Taking AZO with minimal relief.

## 2019-10-10 LAB — URINE CULTURE: Culture: NO GROWTH

## 2020-02-07 ENCOUNTER — Encounter: Payer: Self-pay | Admitting: *Deleted

## 2020-02-07 ENCOUNTER — Ambulatory Visit
Admission: EM | Admit: 2020-02-07 | Discharge: 2020-02-07 | Disposition: A | Discharge status: Home / Self Care | Payer: BC Managed Care – PPO | Attending: Emergency Medicine | Admitting: Emergency Medicine

## 2020-02-07 ENCOUNTER — Other Ambulatory Visit: Payer: Self-pay

## 2020-02-07 DIAGNOSIS — J039 Acute tonsillitis, unspecified: Secondary | ICD-10-CM | POA: Insufficient documentation

## 2020-02-07 DIAGNOSIS — Z7251 High risk heterosexual behavior: Secondary | ICD-10-CM | POA: Insufficient documentation

## 2020-02-07 DIAGNOSIS — J029 Acute pharyngitis, unspecified: Secondary | ICD-10-CM | POA: Diagnosis present

## 2020-02-07 HISTORY — DX: Anxiety disorder, unspecified: F41.9

## 2020-02-07 HISTORY — DX: Depression, unspecified: F32.A

## 2020-02-07 LAB — POCT RAPID STREP A (OFFICE): Rapid Strep A Screen: NEGATIVE

## 2020-02-07 MED ORDER — AMOXICILLIN 500 MG PO TABS: 500.0000 mg | ORAL_TABLET | Freq: Two times a day (BID) | ORAL | 0 refills | Status: AC

## 2020-02-07 NOTE — Discharge Instructions (Addendum)
Your rapid strep test was negative today.  The culture is pending.  Please look on your MyChart for test results.   We will notify you if the culture positive and outline a treatment plan at that time.   Please continue Tylenol and/or Ibuprofen as needed for fever, pain.  May try warm salt water gargles, cepacol lozenges, throat spray, warm tea or water with lemon/honey, or OTC cold relief medicine for throat discomfort.  May gargle, spit viscous lidocaine as prescribed for additional relief.  (Please note this may cause the back of your tongue and mouth to be numb as well)  For congestion: take a daily anti-histamine like Zyrtec, Claritin, and a oral decongestant to help with post nasal drip that may be irritating your throat.   It is important to stay hydrated: drink plenty of fluids (primarily water) to keep your throat moisturized and help further relieve irritation/discomfort.  

## 2020-02-07 NOTE — ED Provider Notes (Signed)
[name]    CSN: [id number] Arrival date & time: 02/07/20  1123      History   Chief Complaint Chief Complaint  Patient presents with  . Sore Throat    HPI Joanna Todd is a 34 y.o. female with history of anxiety depression presenting for sore throat with exudate since waking this morning.  Patient denies fever, difficulty breathing or swallowing, cough, chest pain, vomiting or diarrhea.  Has not tried thing for this.  Patient does admit to unprotected fellatio a few days prior.  Denies vaginal symptoms such as pelvic pain, vaginal pain, vaginal discharge or urinary symptoms such as frequency urgency.   Past Medical History:  Diagnosis Date  . Anxiety   . Depression     Patient Active Problem List   Diagnosis Date Noted  . UTI (urinary tract infection) 03/18/2019  . Mixed obsessional thoughts and acts 02/13/2019  . Mild episode of recurrent major depressive disorder (HCC) 02/13/2019  . Allergic rhinitis 11/20/2014  . Acute bronchitis 11/20/2014  . Costochondritis 11/20/2014  . Adjustment disorder with mixed anxiety and depressed mood 10/01/2014  . Status post repeat low transverse cesarean section 05/15/2011    Past Surgical History:  Procedure Laterality Date  . CESAREAN SECTION      OB History    Gravida  3   Para  3   Term  3   Preterm  0   AB  0   Living  3     SAB  0   TAB  0   Ectopic  0   Multiple  0   Live Births  1            Home Medications    Prior to Admission medications   Medication Sig Start Date End Date Taking? Authorizing Provider  ALPRAZolam Prudy Feeler) 1 MG tablet Take 1 tablet (1 mg total) by mouth 2 (two) times daily as needed for anxiety. 07/07/19   Corie Chiquito, PMHNP  amoxicillin (AMOXIL) 500 MG tablet Take 1 tablet (500 mg total) by mouth 2 (two) times daily for 7 days. 02/07/20 02/14/20  Hall-Potvin, [place], PA-C  DULoxetine (CYMBALTA) 60 MG capsule Take 1 capsule (60 mg total) by mouth daily.  07/07/19   Corie Chiquito, PMHNP  gabapentin (NEURONTIN) 250 MG/5ML solution Take 1 mL (50 mg total) by mouth at bedtime for 30 days. 02/13/19 03/15/19  Corie Chiquito, PMHNP    Family History Family History  Problem Relation Age of Onset  . Hypertension Mother   . Anxiety disorder Mother   . Anxiety disorder Sister   . Anxiety disorder Maternal Aunt   . Anxiety disorder Maternal Grandmother     Social History Social History   Tobacco Use  . Smoking status: Current Every Day Smoker    Packs/day: 0.50  . Smokeless tobacco: Never Used  Substance Use Topics  . Alcohol use: No  . Drug use: No     Allergies   Patient has no known allergies.   Review of Systems As per HPI   Physical Exam Triage Vital Signs ED Triage Vitals  Enc Vitals Group     BP      Pulse      Resp      Temp      Temp src      SpO2      Weight      Height      Head Circumference      Peak Flow  Pain Score      Pain Loc      Pain Edu?      Excl. in [place]?    No data found.  Updated Vital Signs BP 107/70   Pulse 66   Temp 98 F (36.7 C) (Oral)   Resp 16   LMP 01/27/2020 (Approximate)   SpO2 96%   Visual Acuity Right Eye Distance:   Left Eye Distance:   Bilateral Distance:    Right Eye Near:   Left Eye Near:    Bilateral Near:     Physical Exam Constitutional:      General: She is not in acute distress.    Appearance: She is well-developed and normal weight. She is not toxic-appearing.  HENT:     Head: Normocephalic and atraumatic.     Jaw: There is normal jaw occlusion. No tenderness or pain on movement.     Right Ear: Hearing, tympanic membrane, ear canal and external ear normal. No tenderness. No mastoid tenderness.     Left Ear: Hearing, tympanic membrane, ear canal and external ear normal. No tenderness. No mastoid tenderness.     Nose: No nasal deformity, septal deviation or nasal tenderness.     Right Turbinates: Not swollen or pale.     Left Turbinates: Not  swollen or pale.     Right Sinus: No maxillary sinus tenderness or frontal sinus tenderness.     Left Sinus: No maxillary sinus tenderness or frontal sinus tenderness.     Mouth/Throat:     Lips: Pink. No lesions.     Mouth: Mucous membranes are moist. No injury.     Pharynx: Oropharynx is clear. Uvula midline. No posterior oropharyngeal erythema or uvula swelling.     Tonsils: Tonsillar exudate present. No tonsillar abscesses. 3+ on the right. 2+ on the left.  Eyes:     Conjunctiva/sclera: Conjunctivae normal.     Pupils: Pupils are equal, round, and reactive to light.  Cardiovascular:     Rate and Rhythm: Normal rate and regular rhythm.  Pulmonary:     Effort: Pulmonary effort is normal. No respiratory distress.     Breath sounds: No wheezing.  Musculoskeletal:     Cervical back: Normal range of motion and neck supple. No muscular tenderness.  Lymphadenopathy:     Cervical: Cervical adenopathy present.  Skin:    Capillary Refill: Capillary refill takes less than 2 seconds.  Neurological:     Mental Status: She is alert and oriented to person, place, and time.      UC Treatments / Results  Labs (all labs ordered are listed, but only abnormal results are displayed) Labs Reviewed  POCT RAPID STREP A (OFFICE) - Normal  NOVEL CORONAVIRUS, NAA  CULTURE, GROUP A STREP ([place])  CYTOLOGY, (ORAL, ANAL, URETHRAL) ANCILLARY ONLY    EKG   Radiology No results found.  Procedures Procedures (including critical care time)  Medications Ordered in UC Medications - No data to display  Initial Impression / Assessment and Plan / UC Course  I have reviewed the triage vital signs and the nursing notes.  Pertinent labs & imaging results that were available during my care of the patient were reviewed by me and considered in my medical decision making (see chart for details).     Patient afebrile, nontoxic, with SpO2 96%.  Rapid strep negative, culture pending.  Covid PCR pending.   Patient to quarantine until results are back.  We will treat supportively as outlined below.  Will  provide amoxicillin for suspected tonsillitis given unilateral hypertrophy with exudate.  Patient requesting oral cytology for STI: Pending.  Return precautions discussed, patient verbalized understanding and is agreeable to plan. Final Clinical Impressions(s) / UC Diagnoses   Final diagnoses:  Sore throat  Unprotected sex  Tonsillitis     Discharge Instructions     Your rapid strep test was negative today.  The culture is pending.  Please look on your MyChart for test results.   We will notify you if the culture positive and outline a treatment plan at that time.   Please continue Tylenol and/or Ibuprofen as needed for fever, pain.  May try warm salt water gargles, cepacol lozenges, throat spray, warm tea or water with lemon/honey, or OTC cold relief medicine for throat discomfort.  May gargle, spit viscous lidocaine as prescribed for additional relief.  (Please note this may cause the back of your tongue and mouth to be numb as well)  For congestion: take a daily anti-histamine like Zyrtec, Claritin, and a oral decongestant to help with post nasal drip that may be irritating your throat.   It is important to stay hydrated: drink plenty of fluids (primarily water) to keep your throat moisturized and help further relieve irritation/discomfort.     ED Prescriptions    Medication Sig Dispense Auth. Provider   amoxicillin (AMOXIL) 500 MG tablet Take 1 tablet (500 mg total) by mouth 2 (two) times daily for 7 days. 14 tablet Hall-Potvin, [place], PA-C     PDMP not reviewed this encounter.   Hall-Potvin, [place], [place] 02/07/20 1217

## 2020-02-07 NOTE — ED Triage Notes (Signed)
Per pt, woke up this AM with sore throat and describes noticing exudate on tonsils.  Denies any fevers or other sxs.

## 2020-02-08 LAB — NOVEL CORONAVIRUS, NAA: SARS-CoV-2, NAA: NOT DETECTED

## 2020-02-08 LAB — SARS-COV-2, NAA 2 DAY TAT

## 2020-02-10 LAB — CYTOLOGY, (ORAL, ANAL, URETHRAL) ANCILLARY ONLY
Chlamydia: NEGATIVE
Comment: NEGATIVE
Comment: NEGATIVE
Comment: NORMAL
Neisseria Gonorrhea: NEGATIVE
Trichomonas: NEGATIVE

## 2020-02-10 LAB — CULTURE, GROUP A STREP (THRC)

## 2020-05-15 ENCOUNTER — Ambulatory Visit
Admission: RE | Admit: 2020-05-15 | Discharge: 2020-05-15 | Disposition: A | Discharge status: Home / Self Care | Payer: BC Managed Care – PPO | Source: Ambulatory Visit | Attending: Emergency Medicine | Admitting: Emergency Medicine

## 2020-05-15 ENCOUNTER — Other Ambulatory Visit: Payer: Self-pay

## 2020-05-15 VITALS — BP 112/73 | HR 56 | Temp 98.1°F | Resp 18

## 2020-05-15 DIAGNOSIS — R310 Gross hematuria: Secondary | ICD-10-CM | POA: Diagnosis present

## 2020-05-15 LAB — POCT URINALYSIS DIP (MANUAL ENTRY)
Bilirubin, UA: NEGATIVE
Glucose, UA: NEGATIVE mg/dL
Ketones, POC UA: NEGATIVE mg/dL
Leukocytes, UA: NEGATIVE
Nitrite, UA: POSITIVE — AB
Protein Ur, POC: NEGATIVE mg/dL
Spec Grav, UA: >=1.03 — AB (ref 1.010–1.025)
Urobilinogen, UA: 0.2 E.U./dL
pH, UA: 5.5 (ref 5.0–8.0)

## 2020-05-15 LAB — POCT URINE PREGNANCY: Preg Test, Ur: NEGATIVE

## 2020-05-15 NOTE — ED Triage Notes (Signed)
Pt here for hematuria x 2 days; pt denies pain

## 2020-05-15 NOTE — Discharge Instructions (Addendum)
Push fluid (water) and avoid OTC medications.

## 2020-05-15 NOTE — ED Provider Notes (Signed)
[name]    CSN: [id number] Arrival date & time: 05/15/20  1208      History   Chief Complaint Chief Complaint  Patient presents with  . Appointment    1200  . Hematuria    HPI Joanna Todd is a 34 y.o. female  Presenting for hematuria x2 days.  Patient states that she is also had some vaginal bleeding which is abnormal for her: LMP 2 weeks ago.  Denying abdominal or pelvic pain, vaginal discharge, back pain, fever.  Denies frequency, urgency, burning with urination, vaginal irritation.  No recent change in diet, lifestyle, medications, recent illness or fever.  Has not taken thing for this.  Past Medical History:  Diagnosis Date  . Anxiety   . Depression     Patient Active Problem List   Diagnosis Date Noted  . UTI (urinary tract infection) 03/18/2019  . Mixed obsessional thoughts and acts 02/13/2019  . Mild episode of recurrent major depressive disorder (HCC) 02/13/2019  . Allergic rhinitis 11/20/2014  . Acute bronchitis 11/20/2014  . Costochondritis 11/20/2014  . Adjustment disorder with mixed anxiety and depressed mood 10/01/2014  . Status post repeat low transverse cesarean section 05/15/2011    Past Surgical History:  Procedure Laterality Date  . CESAREAN SECTION      OB History    Gravida  3   Para  3   Term  3   Preterm  0   AB  0   Living  3     SAB  0   TAB  0   Ectopic  0   Multiple  0   Live Births  1            Home Medications    Prior to Admission medications   Medication Sig Start Date End Date Taking? Authorizing Provider  ALPRAZolam Prudy Feeler) 1 MG tablet Take 1 tablet (1 mg total) by mouth 2 (two) times daily as needed for anxiety. 07/07/19   Corie Chiquito, PMHNP  DULoxetine (CYMBALTA) 60 MG capsule Take 1 capsule (60 mg total) by mouth daily. 07/07/19   Corie Chiquito, PMHNP  gabapentin (NEURONTIN) 250 MG/5ML solution Take 1 mL (50 mg total) by mouth at bedtime for 30 days. 02/13/19 03/15/19   Corie Chiquito, PMHNP    Family History Family History  Problem Relation Age of Onset  . Hypertension Mother   . Anxiety disorder Mother   . Anxiety disorder Sister   . Anxiety disorder Maternal Aunt   . Anxiety disorder Maternal Grandmother     Social History Social History   Tobacco Use  . Smoking status: Current Every Day Smoker    Packs/day: 0.50  . Smokeless tobacco: Never Used  Vaping Use  . Vaping Use: Never used  Substance Use Topics  . Alcohol use: No  . Drug use: No     Allergies   Patient has no known allergies.   Review of Systems As per HPI   Physical Exam Triage Vital Signs ED Triage Vitals  Enc Vitals Group     BP      Pulse      Resp      Temp      Temp src      SpO2      Weight      Height      Head Circumference      Peak Flow      Pain Score      Pain Loc  Pain Edu?      Excl. in [place]?    No data found.  Updated Vital Signs BP 112/73 (BP Location: Left Arm)   Pulse (!) 56   Temp 98.1 F (36.7 C) (Oral)   Resp 18   SpO2 98%   Visual Acuity Right Eye Distance:   Left Eye Distance:   Bilateral Distance:    Right Eye Near:   Left Eye Near:    Bilateral Near:     Physical Exam Constitutional:      General: She is not in acute distress. HENT:     Head: Normocephalic and atraumatic.  Eyes:     General: No scleral icterus.    Pupils: Pupils are equal, round, and reactive to light.  Cardiovascular:     Rate and Rhythm: Normal rate.  Pulmonary:     Effort: Pulmonary effort is normal.  Abdominal:     General: Bowel sounds are normal.     Palpations: Abdomen is soft.     Tenderness: There is no abdominal tenderness. There is no right CVA tenderness, left CVA tenderness or guarding.  Skin:    Coloration: Skin is not jaundiced or pale.  Neurological:     Mental Status: She is alert and oriented to person, place, and time.      UC Treatments / Results  Labs (all labs ordered are listed, but only abnormal  results are displayed) Labs Reviewed  POCT URINALYSIS DIP (MANUAL ENTRY) - Abnormal; Notable for the following components:      Result Value   Spec Grav, UA >=1.030 (*)    Blood, UA large (*)    Nitrite, UA Positive (*)    All other components within normal limits  URINE CULTURE  POCT URINE PREGNANCY    EKG   Radiology No results found.  Procedures Procedures (including critical care time)  Medications Ordered in UC Medications - No data to display  Initial Impression / Assessment and Plan / UC Course  I have reviewed the triage vital signs and the nursing notes.  Pertinent labs & imaging results that were available during my care of the patient were reviewed by me and considered in my medical decision making (see chart for details).     Urine pregnancy negative, urine dipstick with nitrates, blood-culture pending.  Will push fluids, avoid OTC medications, nitrite rich foods, and follow-up with PCP Monday.  Return precautions discussed, pt verbalized understanding and is agreeable to plan. Final Clinical Impressions(s) / UC Diagnoses   Final diagnoses:  Gross hematuria     Discharge Instructions     Push fluid (water) and avoid OTC medications.    ED Prescriptions    None     PDMP not reviewed this encounter.   Hall-Potvin, [place], [place] 05/15/20 1329

## 2020-05-19 ENCOUNTER — Telehealth (HOSPITAL_COMMUNITY): Payer: Self-pay | Admitting: Emergency Medicine

## 2020-05-19 LAB — URINE CULTURE: Culture: >=100000 — AB

## 2020-05-19 MED ORDER — SULFAMETHOXAZOLE-TRIMETHOPRIM 800-160 MG PO TABS
1.0000 | ORAL_TABLET | Freq: Two times a day (BID) | ORAL | 0 refills | Status: AC
Start: 2020-05-19 — End: 2020-05-22

## 2020-05-24 ENCOUNTER — Ambulatory Visit: Payer: BC Managed Care – PPO | Admitting: Family Medicine

## 2020-05-24 DIAGNOSIS — Z0289 Encounter for other administrative examinations: Secondary | ICD-10-CM

## 2021-02-08 ENCOUNTER — Emergency Department (HOSPITAL_COMMUNITY)
Admission: EM | Admit: 2021-02-08 | Discharge: 2021-02-09 | Disposition: A | Discharge status: Home / Self Care | Payer: BC Managed Care – PPO | Attending: Emergency Medicine | Admitting: Emergency Medicine

## 2021-02-08 ENCOUNTER — Other Ambulatory Visit: Payer: Self-pay

## 2021-02-08 ENCOUNTER — Ambulatory Visit: Payer: Self-pay

## 2021-02-08 DIAGNOSIS — N939 Abnormal uterine and vaginal bleeding, unspecified: Secondary | ICD-10-CM | POA: Insufficient documentation

## 2021-02-08 DIAGNOSIS — R531 Weakness: Secondary | ICD-10-CM | POA: Insufficient documentation

## 2021-02-08 DIAGNOSIS — Z5321 Procedure and treatment not carried out due to patient leaving prior to being seen by health care provider: Secondary | ICD-10-CM | POA: Insufficient documentation

## 2021-02-08 LAB — COMPREHENSIVE METABOLIC PANEL
ALT: 15 U/L (ref 0–44)
AST: 20 U/L (ref 15–41)
Albumin: 4 g/dL (ref 3.5–5.0)
Alkaline Phosphatase: 48 U/L (ref 38–126)
Anion gap: 6 (ref 5–15)
BUN: 9 mg/dL (ref 6–20)
CO2: 25 mmol/L (ref 22–32)
Calcium: 9.1 mg/dL (ref 8.9–10.3)
Chloride: 109 mmol/L (ref 98–111)
Creatinine, Ser: 0.84 mg/dL (ref 0.44–1.00)
GFR, Estimated: >60 mL/min (ref >60)
Glucose, Bld: 85 mg/dL (ref 70–99)
Potassium: 4 mmol/L (ref 3.5–5.1)
Sodium: 140 mmol/L (ref 135–145)
Total Bilirubin: 0.5 mg/dL (ref 0.3–1.2)
Total Protein: 6.7 g/dL (ref 6.5–8.1)

## 2021-02-08 LAB — CBC
HCT: 36.8 % (ref 36.0–46.0)
Hemoglobin: 12.1 g/dL (ref 12.0–15.0)
MCH: 30 pg (ref 26.0–34.0)
MCHC: 32.9 g/dL (ref 30.0–36.0)
MCV: 91.1 fL (ref 80.0–100.0)
Platelets: 321 10*3/uL (ref 150–400)
RBC: 4.04 MIL/uL (ref 3.87–5.11)
RDW: 12.3 % (ref 11.5–15.5)
WBC: 12.1 10*3/uL — ABNORMAL HIGH (ref 4.0–10.5)
nRBC: 0 % (ref 0.0–0.2)

## 2021-02-08 LAB — I-STAT BETA HCG BLOOD, ED (MC, WL, AP ONLY): I-stat hCG, quantitative: <5 m[IU]/mL (ref <5)

## 2021-02-08 LAB — TYPE AND SCREEN
ABO/RH(D): A NEG
Antibody Screen: NEGATIVE

## 2021-02-08 NOTE — Telephone Encounter (Signed)
Agree with er

## 2021-02-08 NOTE — Telephone Encounter (Signed)
Pt c/o severe vaginal bleeding with quarter sized clots that started 2 weeks ago. Denies abdominal pain. C/o feeling weak.  LMP 3 weeks ago and according to pt was normal.  Pt stated her cycle starts every 28 days and is regular. Pt has had a tubal ligation so denied pregnancy. Pt stated she is sexually active. Denies fever.  Pt advised to go to [facility] ED. Pt stated she was not dizzy and ok to drive to main campus. Care advice given and pt verbalized understanding.     Reason for Disposition . SEVERE vaginal bleeding (e.g., soaking 2 pads or tampons per hour and present 2 or more hours; 1 menstrual cup every 2 hours)  Answer Assessment - Initial Assessment Questions 1. AMOUNT: "Describe the bleeding that you are having."    - SPOTTING: spotting, or pinkish / brownish mucous discharge; does not fill panty liner or pad    - MILD:  less than 1 pad / hour; less than patient's usual menstrual bleeding   - MODERATE: 1-2 pads / hour; 1 menstrual cup every 6 hours; small-medium blood clots (e.g., pea, grape, small coin)   - SEVERE: soaking 2 or more pads/hour for 2 or more hours; 1 menstrual cup every 2 hours; bleeding not contained by pads or continuous red blood from vagina; large blood clots (e.g., golf ball, large coin)      severe 2. ONSET: "When did the bleeding begin?" "Is it continuing now?"     2 weeks ago 3. MENSTRUAL PERIOD: "When was the last normal menstrual period?" "How is this different than your period?"     Heavier, clots size quarter sized 4. REGULARITY: "How regular are your periods?"     28 day 5. ABDOMINAL PAIN: "Do you have any pain?" "How bad is the pain?"  (e.g., Scale 1-10; mild, moderate, or severe)   - MILD (1-3): doesn't interfere with normal activities, abdomen soft and not tender to touch    - MODERATE (4-7): interferes with normal activities or awakens from sleep, abdomen tender to touch    - SEVERE (8-10): excruciating pain, doubled over, unable to do any normal  activities      no 6. PREGNANCY: "Could you be pregnant?" "Are you sexually active?" "Did you recently give birth?"     No- tubes/yes/no 7. BREASTFEEDING: "Are you breastfeeding?"     no 8. HORMONES: "Are you taking any hormone medications, prescription or OTC?" (e.g., birth control pills, estrogen)     no 9. BLOOD THINNERS: "Do you take any blood thinners?" (e.g., Coumadin/warfarin, Pradaxa/dabigatran, aspirin)     no 10. CAUSE: "What do you think is causing the bleeding?" (e.g., recent gyn surgery, recent gyn procedure; known bleeding disorder, cervical cancer, polycystic ovarian disease, fibroids)         Pt doesn't know 11. HEMODYNAMIC STATUS: "Are you weak or feeling lightheaded?" If Yes, ask: "Can you stand and walk normally?"        Weak- 12. OTHER SYMPTOMS: "What other symptoms are you having with the bleeding?" (e.g., passed tissue, vaginal discharge, fever, menstrual-type cramps)       no  Protocols used: VAGINAL BLEEDING - ABNORMAL-A-AH

## 2021-02-08 NOTE — Telephone Encounter (Signed)
FYI: pt advised to go to the ED

## 2021-02-08 NOTE — ED Provider Notes (Signed)
Emergency Medicine Provider Triage Evaluation Note  Joanna Todd , a 35 y.o. female  was evaluated in triage.  Pt complains of vaginal bleeding for 2 weeks. She states she already had her menstrual cycle this month.  She has had her tubes tied.  She is complaining of generalized weakness, passing quarter sized blood clots and soaking about 1 pad per hour.  This is significantly different than her usual menstrual cycles.  She had her tubes tied  Review of Systems  Positive: Vaginal bleeding Negative: fevers  Physical Exam  BP 118/65 (BP Location: Right Arm)   Pulse (!) 55   Temp 98.5 F (36.9 C) (Oral)   Resp 18   SpO2 99%  Gen:   Awake, no distress   Resp:  Normal effort  MSK:   Moves extremities without difficulty  Other:  Awake and alert, answers questions appropriately.  Speech is not slurred.  Medical Decision Making  Medically screening exam initiated at 5:00 PM.  Appropriate orders placed.  KRISHNA HEUER was informed that the remainder of the evaluation will be completed by another provider, this initial triage assessment does not replace that evaluation, and the importance of remaining in the ED until their evaluation is complete.     Cristina Gong, PA-C 02/08/21 1702    Virgina Norfolk, DO 02/08/21 1929

## 2021-02-08 NOTE — ED Triage Notes (Signed)
Pt reports vaginal bleeding x 2 weeks, and has already had her menstrual cycle this month. Endorses generalized weakness, passing blood clots and soaking approximately one pad per hour.

## 2021-02-09 ENCOUNTER — Ambulatory Visit
Admission: EM | Admit: 2021-02-09 | Discharge: 2021-02-09 | Disposition: A | Discharge status: Home / Self Care | Payer: Self-pay | Attending: Student | Admitting: Student

## 2021-02-09 ENCOUNTER — Encounter: Payer: Self-pay | Admitting: Emergency Medicine

## 2021-02-09 DIAGNOSIS — Z113 Encounter for screening for infections with a predominantly sexual mode of transmission: Secondary | ICD-10-CM | POA: Insufficient documentation

## 2021-02-09 DIAGNOSIS — Z789 Other specified health status: Secondary | ICD-10-CM | POA: Insufficient documentation

## 2021-02-09 DIAGNOSIS — N921 Excessive and frequent menstruation with irregular cycle: Secondary | ICD-10-CM | POA: Insufficient documentation

## 2021-02-09 DIAGNOSIS — N939 Abnormal uterine and vaginal bleeding, unspecified: Secondary | ICD-10-CM | POA: Insufficient documentation

## 2021-02-09 LAB — POCT URINALYSIS DIP (MANUAL ENTRY)
Bilirubin, UA: NEGATIVE
Glucose, UA: NEGATIVE mg/dL
Ketones, POC UA: NEGATIVE mg/dL
Leukocytes, UA: NEGATIVE
Nitrite, UA: NEGATIVE
Protein Ur, POC: NEGATIVE mg/dL
Spec Grav, UA: 1.025 (ref 1.010–1.025)
Urobilinogen, UA: 0.2 E.U./dL
pH, UA: 5.5 (ref 5.0–8.0)

## 2021-02-09 LAB — POCT URINE PREGNANCY: Preg Test, Ur: NEGATIVE

## 2021-02-09 MED ORDER — MEGESTROL ACETATE 40 MG PO TABS: 40.0000 mg | ORAL_TABLET | Freq: Two times a day (BID) | ORAL | 0 refills | Status: AC

## 2021-02-09 NOTE — ED Triage Notes (Signed)
Pt present vaginal bleeding for two weeks. Pt states she is passing out blood clots and that she has an infection per lab result in my chart

## 2021-02-09 NOTE — ED Notes (Signed)
Pt states that she is leaving.  

## 2021-02-09 NOTE — Discharge Instructions (Addendum)
-  Start the new medication, Megace twice daily for up to 14 days.  Try to follow-up with a gynecologist within that timeframe. Information below, call them to schedule this appointment. -Seek additional medical attention if you develop new symptoms like weakness, shortness of breath, unusual fatigue, abdominal pain, urinary or vaginal symptoms.

## 2021-02-09 NOTE — ED Provider Notes (Signed)
[name]    CSN: [id number] Arrival date & time: 02/09/21  1331      History   Chief Complaint Chief Complaint  Patient presents with  . Vaginal Bleeding    HPI Joanna Todd is a 35 y.o. female presenting with vaginal bleeding x2 weeks.  Medical history anxiety, depression, UTI.  This patient presented to the emergency department 1 day ago for her symptoms, states she left after the work-up was started due to the wait time.  Tubal ligation for contraception.  States that she had already had her menstrual cycle once this month, and was surprised to start this again.  Has now been bleeding for 2 weeks with some clotting.  Passing quarter sized blood clots and soaking about 1 pad per hour.  States this is significantly different from her usual menstrual cycles.  In the ED she endorsed generalized weakness, but she denies this today.  Denies dizziness, shortness of breath, fatigue.  Sexually active with 1 female partner, denies STI risk but is requesting STI panel.  Denies urinary symptoms including dysuria, frequency, abdominal pain, back pain.  HPI  Past Medical History:  Diagnosis Date  . Anxiety   . Depression     Patient Active Problem List   Diagnosis Date Noted  . UTI (urinary tract infection) 03/18/2019  . Mixed obsessional thoughts and acts 02/13/2019  . Mild episode of recurrent major depressive disorder (HCC) 02/13/2019  . Allergic rhinitis 11/20/2014  . Acute bronchitis 11/20/2014  . Costochondritis 11/20/2014  . Adjustment disorder with mixed anxiety and depressed mood 10/01/2014  . Status post repeat low transverse cesarean section 05/15/2011    Past Surgical History:  Procedure Laterality Date  . CESAREAN SECTION      OB History    Gravida  3   Para  3   Term  3   Preterm  0   AB  0   Living  3     SAB  0   IAB  0   Ectopic  0   Multiple  0   Live Births  1            Home Medications    Prior to Admission  medications   Medication Sig Start Date End Date Taking? Authorizing Provider  megestrol (MEGACE) 40 MG tablet Take 1 tablet (40 mg total) by mouth 2 (two) times daily for 14 days. 02/09/21 02/23/21 Yes Rhys Martini, PA-C  ALPRAZolam Prudy Feeler) 1 MG tablet Take 1 tablet (1 mg total) by mouth 2 (two) times daily as needed for anxiety. 07/07/19   Corie Chiquito, PMHNP  DULoxetine (CYMBALTA) 60 MG capsule Take 1 capsule (60 mg total) by mouth daily. 07/07/19   Corie Chiquito, PMHNP  gabapentin (NEURONTIN) 250 MG/5ML solution Take 1 mL (50 mg total) by mouth at bedtime for 30 days. 02/13/19 03/15/19  Corie Chiquito, PMHNP    Family History Family History  Problem Relation Age of Onset  . Hypertension Mother   . Anxiety disorder Mother   . Anxiety disorder Sister   . Anxiety disorder Maternal Aunt   . Anxiety disorder Maternal Grandmother     Social History Social History   Tobacco Use  . Smoking status: Current Every Day Smoker    Packs/day: 0.50  . Smokeless tobacco: Never Used  Vaping Use  . Vaping Use: Never used  Substance Use Topics  . Alcohol use: No  . Drug use: No     Allergies  Patient has no known allergies.   Review of Systems Review of Systems  Constitutional: Negative for chills and fever.  HENT: Negative for sore throat.   Eyes: Negative for pain and redness.  Respiratory: Negative for shortness of breath.   Cardiovascular: Negative for chest pain.  Gastrointestinal: Negative for abdominal pain, diarrhea, nausea and vomiting.  Genitourinary: Positive for menstrual problem. Negative for decreased urine volume, difficulty urinating, dysuria, flank pain, frequency, genital sores, hematuria, pelvic pain, urgency, vaginal bleeding, vaginal discharge and vaginal pain.  Musculoskeletal: Negative for back pain.  Skin: Negative for rash.  All other systems reviewed and are negative.    Physical Exam Triage Vital Signs ED Triage Vitals [02/09/21 1533]  Enc Vitals  Group     BP 109/71     Pulse Rate (!) 55     Resp 16     Temp 97.9 F (36.6 C)     Temp Source Oral     SpO2 98 %     Weight      Height      Head Circumference      Peak Flow      Pain Score      Pain Loc      Pain Edu?      Excl. in GC?    No data found.  Updated Vital Signs BP 109/71 (BP Location: Left Arm)   Pulse (!) 55   Temp 97.9 F (36.6 C) (Oral)   Resp 16   SpO2 98%   Visual Acuity Right Eye Distance:   Left Eye Distance:   Bilateral Distance:    Right Eye Near:   Left Eye Near:    Bilateral Near:     Physical Exam Vitals reviewed.  Constitutional:      General: She is not in acute distress.    Appearance: Normal appearance. She is not ill-appearing.  HENT:     Head: Normocephalic and atraumatic.     Mouth/Throat:     Mouth: Mucous membranes are moist.     Comments: Moist mucous membranes Eyes:     Extraocular Movements: Extraocular movements intact.     Pupils: Pupils are equal, round, and reactive to light.  Cardiovascular:     Rate and Rhythm: Normal rate and regular rhythm.     Heart sounds: Normal heart sounds.  Pulmonary:     Effort: Pulmonary effort is normal.     Breath sounds: Normal breath sounds. No wheezing, rhonchi or rales.  Abdominal:     General: Bowel sounds are normal. There is no distension.     Palpations: Abdomen is soft. There is no mass.     Tenderness: There is no abdominal tenderness. There is no right CVA tenderness, left CVA tenderness, guarding or rebound.  Genitourinary:    Comments: deferre Skin:    General: Skin is warm.     Capillary Refill: Capillary refill takes less than 2 seconds.     Comments: Good skin turgor  Neurological:     General: No focal deficit present.     Mental Status: She is alert and oriented to person, place, and time.  Psychiatric:        Mood and Affect: Mood normal.        Behavior: Behavior normal.      UC Treatments / Results  Labs (all labs ordered are listed, but only  abnormal results are displayed) Labs Reviewed  POCT URINALYSIS DIP (MANUAL ENTRY) - Abnormal; Notable for the following components:  Result Value   Blood, UA large (*)    All other components within normal limits  HIV ANTIBODY (ROUTINE TESTING W REFLEX)  RPR  POCT URINE PREGNANCY  CERVICOVAGINAL ANCILLARY ONLY    EKG   Radiology No results found.  Procedures Procedures (including critical care time)  Medications Ordered in UC Medications - No data to display  Initial Impression / Assessment and Plan / UC Course  I have reviewed the triage vital signs and the nursing notes.  Pertinent labs & imaging results that were available during my care of the patient were reviewed by me and considered in my medical decision making (see chart for details).     This patient is a 35 year old female presenting with menorrhagia.  Afebrile, nontachycardic.  Borderline bradycardic but asymptomatic.  Appears well-hydrated.  Tubal ligation for contraception.  Negative quant hcg one day ago performed in ED, negative u preg today. They also checked a CBC and CMP which were fairly normal except for mildly elevated WBC. UA with large blood, otherwise wnl. Patient denies STI risk or symptoms, but requests self swab for gonorrhea, chlamydia, trichomonas, BV, yeast, HIV, syphilis.  Sent. Denies respiratory symptoms or recent URI.  Megace as below.  Follow-up with GYN.  Information provided.  ED return precautions discussed.  Final Clinical Impressions(s) / UC Diagnoses   Final diagnoses:  Abnormal uterine bleeding (AUB)  Relies on tubal ligation as primary birth control method  Routine screening for STI (sexually transmitted infection)  Menorrhagia with irregular cycle     Discharge Instructions     -Start the new medication, Megace twice daily for up to 14 days.  Try to follow-up with a gynecologist within that timeframe. Information below, call them to schedule this  appointment. -Seek additional medical attention if you develop new symptoms like weakness, shortness of breath, unusual fatigue, abdominal pain, urinary or vaginal symptoms.     ED Prescriptions    Medication Sig Dispense Auth. Provider   megestrol (MEGACE) 40 MG tablet Take 1 tablet (40 mg total) by mouth 2 (two) times daily for 14 days. 28 tablet Rhys Martini, PA-C     PDMP not reviewed this encounter.   Rhys Martini, PA-C 02/09/21 1659

## 2021-02-10 LAB — RPR: RPR Ser Ql: NONREACTIVE

## 2021-02-10 LAB — HIV ANTIBODY (ROUTINE TESTING W REFLEX): HIV Screen 4th Generation wRfx: NONREACTIVE

## 2021-02-11 ENCOUNTER — Telehealth (HOSPITAL_COMMUNITY): Payer: Self-pay | Admitting: Emergency Medicine

## 2021-02-11 LAB — CERVICOVAGINAL ANCILLARY ONLY
Bacterial Vaginitis (gardnerella): POSITIVE — AB
Candida Glabrata: NEGATIVE
Candida Vaginitis: NEGATIVE
Chlamydia: NEGATIVE
Comment: NEGATIVE
Comment: NEGATIVE
Comment: NEGATIVE
Comment: NEGATIVE
Comment: NEGATIVE
Comment: NORMAL
Neisseria Gonorrhea: NEGATIVE
Trichomonas: NEGATIVE

## 2021-02-11 MED ORDER — METRONIDAZOLE 500 MG PO TABS
500.0000 mg | ORAL_TABLET | Freq: Two times a day (BID) | ORAL | 0 refills | Status: DC
Start: 2021-02-11 — End: 2021-04-25

## 2021-04-25 ENCOUNTER — Ambulatory Visit
Admission: EM | Admit: 2021-04-25 | Discharge: 2021-04-25 | Disposition: A | Discharge status: Home / Self Care | Payer: Self-pay | Attending: Internal Medicine | Admitting: Internal Medicine

## 2021-04-25 ENCOUNTER — Other Ambulatory Visit: Payer: Self-pay

## 2021-04-25 ENCOUNTER — Encounter: Payer: Self-pay | Admitting: Emergency Medicine

## 2021-04-25 DIAGNOSIS — J029 Acute pharyngitis, unspecified: Secondary | ICD-10-CM

## 2021-04-25 DIAGNOSIS — Z20822 Contact with and (suspected) exposure to covid-19: Secondary | ICD-10-CM

## 2021-04-25 LAB — POCT RAPID STREP A (OFFICE): Rapid Strep A Screen: NEGATIVE

## 2021-04-25 MED ORDER — AMOXICILLIN-POT CLAVULANATE 875-125 MG PO TABS: 1.0000 | ORAL_TABLET | Freq: Two times a day (BID) | ORAL | 0 refills | Status: DC

## 2021-04-25 NOTE — Discharge Instructions (Addendum)
Your rapid strep test was negative in urgent care today. Throat culture and covid 19 viral swab are pending. We will call if these are positive. You have been prescribed augmentin antibiotic due to suspicion of strep throat.

## 2021-04-25 NOTE — ED Provider Notes (Signed)
[name]    CSN: [id number] Arrival date & time: 04/25/21  1348      History   Chief Complaint Chief Complaint  Patient presents with   Sore Throat    HPI Joanna Todd is a 35 y.o. female.   Patient presents with 2 day history of sore throat. Denies any fevers, upper respiratory, or known sick contacts. Patient has not yet taken any OTC medications to alleviate symptoms. Denies any chest pain or shortness of breath. Denies any abdominal pain, nausea, vomiting, diarrhea.    Sore Throat   Past Medical History:  Diagnosis Date   Anxiety    Depression     Patient Active Problem List   Diagnosis Date Noted   UTI (urinary tract infection) 03/18/2019   Mixed obsessional thoughts and acts 02/13/2019   Mild episode of recurrent major depressive disorder (HCC) 02/13/2019   Allergic rhinitis 11/20/2014   Acute bronchitis 11/20/2014   Costochondritis 11/20/2014   Adjustment disorder with mixed anxiety and depressed mood 10/01/2014   Status post repeat low transverse cesarean section 05/15/2011    Past Surgical History:  Procedure Laterality Date   CESAREAN SECTION      OB History     Gravida  3   Para  3   Term  3   Preterm  0   AB  0   Living  3      SAB  0   IAB  0   Ectopic  0   Multiple  0   Live Births  1            Home Medications    Prior to Admission medications   Medication Sig Start Date End Date Taking? Authorizing Provider  amoxicillin-clavulanate (AUGMENTIN) 875-125 MG tablet Take 1 tablet by mouth every 12 (twelve) hours. 04/25/21  Yes Lance Muss, FNP  ALPRAZolam Prudy Feeler) 1 MG tablet Take 1 tablet (1 mg total) by mouth 2 (two) times daily as needed for anxiety. 07/07/19   Corie Chiquito, PMHNP  DULoxetine (CYMBALTA) 60 MG capsule Take 1 capsule (60 mg total) by mouth daily. 07/07/19   Corie Chiquito, PMHNP  gabapentin (NEURONTIN) 250 MG/5ML solution Take 1 mL (50 mg total) by mouth at bedtime for 30  days. 02/13/19 03/15/19  Corie Chiquito, PMHNP    Family History Family History  Problem Relation Age of Onset   Hypertension Mother    Anxiety disorder Mother    Anxiety disorder Sister    Anxiety disorder Maternal Aunt    Anxiety disorder Maternal Grandmother     Social History Social History   Tobacco Use   Smoking status: Every Day    Packs/day: 0.50    Types: Cigarettes   Smokeless tobacco: Never  Vaping Use   Vaping Use: Never used  Substance Use Topics   Alcohol use: No   Drug use: No     Allergies   Patient has no known allergies.   Review of Systems Review of Systems Per HPI  Physical Exam Triage Vital Signs ED Triage Vitals  Enc Vitals Group     BP 04/25/21 1403 118/75     Pulse Rate 04/25/21 1403 70     Resp 04/25/21 1403 18     Temp 04/25/21 1403 98 F (36.7 C)     Temp Source 04/25/21 1403 Oral     SpO2 04/25/21 1403 98 %     Weight --      Height --  Head Circumference --      Peak Flow --      Pain Score 04/25/21 1408 4     Pain Loc --      Pain Edu? --      Excl. in GC? --    No data found.  Updated Vital Signs BP 118/75 (BP Location: Left Arm)   Pulse 70   Temp 98 F (36.7 C) (Oral)   Resp 18   SpO2 98%   Visual Acuity Right Eye Distance:   Left Eye Distance:   Bilateral Distance:    Right Eye Near:   Left Eye Near:    Bilateral Near:     Physical Exam Constitutional:      Appearance: Normal appearance.  HENT:     Head: Normocephalic and atraumatic.     Right Ear: Tympanic membrane and ear canal normal.     Left Ear: Tympanic membrane and ear canal normal.     Nose: Nose normal.     Mouth/Throat:     Lips: Pink.     Mouth: Mucous membranes are moist.     Pharynx: Oropharyngeal exudate and posterior oropharyngeal erythema present.     Comments: Exudate present to right posterior pharynx Eyes:     Extraocular Movements: Extraocular movements intact.     Conjunctiva/sclera: Conjunctivae normal.   Cardiovascular:     Rate and Rhythm: Normal rate and regular rhythm.     Pulses: Normal pulses.     Heart sounds: Normal heart sounds.  Pulmonary:     Effort: Pulmonary effort is normal.     Breath sounds: Normal breath sounds.  Skin:    General: Skin is warm and dry.  Neurological:     General: No focal deficit present.     Mental Status: She is alert and oriented to person, place, and time. Mental status is at baseline.  Psychiatric:        Mood and Affect: Mood normal.        Behavior: Behavior normal.        Thought Content: Thought content normal.        Judgment: Judgment normal.     UC Treatments / Results  Labs (all labs ordered are listed, but only abnormal results are displayed) Labs Reviewed  CULTURE, GROUP A STREP ([facility])  NOVEL CORONAVIRUS, NAA  POCT RAPID STREP A (OFFICE)    EKG   Radiology No results found.  Procedures Procedures (including critical care time)  Medications Ordered in UC Medications - No data to display  Initial Impression / Assessment and Plan / UC Course  I have reviewed the triage vital signs and the nursing notes.  Pertinent labs & imaging results that were available during my care of the patient were reviewed by me and considered in my medical decision making (see chart for details).     Rapid strep test was negative in urgent care but highly suspicious of strep throat due to appearance of posterior pharynx on physical exam. Will treat with augmentin x10 days. Throat culture and covid 19 swab pending. Discussed strict return precautions. Patient verbalized understanding and is agreeable with plan.  Final Clinical Impressions(s) / UC Diagnoses   Final diagnoses:  Sore throat  Encounter for laboratory testing for COVID-19 virus     Discharge Instructions      Your rapid strep test was negative in urgent care today. Throat culture and covid 19 viral swab are pending. We will call if these are positive. You  have been  prescribed augmentin antibiotic due to suspicion of strep throat.      ED Prescriptions     Medication Sig Dispense Auth. Provider   amoxicillin-clavulanate (AUGMENTIN) 875-125 MG tablet Take 1 tablet by mouth every 12 (twelve) hours. 14 tablet Lance Muss, FNP      PDMP not reviewed this encounter.   Lance Muss, FNP 04/25/21 1431

## 2021-04-25 NOTE — ED Triage Notes (Signed)
Pt here for sore throat x 2 days that she thinks is strep throat with hx of same

## 2021-04-26 LAB — SARS-COV-2, NAA 2 DAY TAT

## 2021-04-26 LAB — NOVEL CORONAVIRUS, NAA: SARS-CoV-2, NAA: NOT DETECTED

## 2021-04-27 LAB — CULTURE, GROUP A STREP (THRC)

## 2022-08-21 ENCOUNTER — Ambulatory Visit
Admission: EM | Admit: 2022-08-21 | Discharge: 2022-08-21 | Disposition: A | Discharge status: Home / Self Care | Payer: Self-pay | Attending: Urgent Care | Admitting: Urgent Care

## 2022-08-21 DIAGNOSIS — J020 Streptococcal pharyngitis: Secondary | ICD-10-CM

## 2022-08-21 DIAGNOSIS — R070 Pain in throat: Secondary | ICD-10-CM

## 2022-08-21 LAB — POCT RAPID STREP A (OFFICE): Rapid Strep A Screen: POSITIVE — AB

## 2022-08-21 MED ORDER — CETIRIZINE HCL 10 MG PO TABS: 10.0000 mg | ORAL_TABLET | Freq: Every day | ORAL | 0 refills | Status: AC

## 2022-08-21 MED ORDER — PSEUDOEPHEDRINE HCL 60 MG PO TABS: 60.0000 mg | ORAL_TABLET | Freq: Three times a day (TID) | ORAL | 0 refills | Status: AC | PRN

## 2022-08-21 MED ORDER — IBUPROFEN 600 MG PO TABS: 600.0000 mg | ORAL_TABLET | Freq: Four times a day (QID) | ORAL | 0 refills | Status: AC | PRN

## 2022-08-21 MED ORDER — AMOXICILLIN 500 MG PO CAPS: 500.0000 mg | ORAL_CAPSULE | Freq: Two times a day (BID) | ORAL | 0 refills | Status: DC

## 2022-08-21 NOTE — ED Triage Notes (Signed)
Pt c/o sore throat x 2-3 days-NAD-steady gait 

## 2022-08-21 NOTE — ED Provider Notes (Signed)
[facility]  Note:  This document was prepared using Conservation officer, historic buildings and may include unintentional dictation errors.  MRN: [id number] DOB: 01-02-1986  Subjective:   Joanna Todd is a 36 y.o. female presenting for 2-3 throat pain, painful swallowing, sinus drainage. Smokes 1/2ppd. Has had possible strep exposure. No fever, cough, chest pain, shob, wheezing. Her sister did a flu test that was negative. Her nephew is sick with the same symptoms.   No chronic medications.    No Known Allergies  Past Medical History:  Diagnosis Date   Anxiety    Depression      Past Surgical History:  Procedure Laterality Date   CESAREAN SECTION      Family History  Problem Relation Age of Onset   Hypertension Mother    Anxiety disorder Mother    Anxiety disorder Sister    Anxiety disorder Maternal Aunt    Anxiety disorder Maternal Grandmother     Social History   Tobacco Use   Smoking status: Every Day    Packs/day: 0.50    Types: Cigarettes   Smokeless tobacco: Never  Vaping Use   Vaping Use: Never used  Substance Use Topics   Alcohol use: No   Drug use: No    ROS   Objective:   Vitals: BP (!) 109/56 (BP Location: Right Arm)   Pulse (!) 52   Temp 98.1 F (36.7 C) (Oral)   Resp 16   LMP 08/04/2022 (Approximate)   SpO2 97%   Physical Exam Constitutional:      General: She is not in acute distress.    Appearance: Normal appearance. She is well-developed. She is not ill-appearing, toxic-appearing or diaphoretic.  HENT:     Head: Normocephalic and atraumatic.     Nose: Congestion present. No rhinorrhea.     Mouth/Throat:     Mouth: Mucous membranes are moist.     Pharynx: Oropharyngeal exudate and posterior oropharyngeal erythema present. No pharyngeal swelling or uvula swelling.     Tonsils: Tonsillar exudate present. No tonsillar abscesses. 1+ on the right. 1+ on the left.  Eyes:     General: No scleral icterus.        Right eye: No discharge.        Left eye: No discharge.     Extraocular Movements: Extraocular movements intact.  Cardiovascular:     Rate and Rhythm: Normal rate and regular rhythm.     Heart sounds: Normal heart sounds. No murmur heard.    No friction rub. No gallop.  Pulmonary:     Effort: Pulmonary effort is normal. No respiratory distress.     Breath sounds: No stridor. No wheezing, rhonchi or rales.  Chest:     Chest wall: No tenderness.  Skin:    General: Skin is warm and dry.  Neurological:     General: No focal deficit present.     Mental Status: She is alert and oriented to person, place, and time.  Psychiatric:        Mood and Affect: Mood normal.        Behavior: Behavior normal.    Results for orders placed or performed during the hospital encounter of 08/21/22 (from the past 24 hour(s))  POCT rapid strep A     Status: Abnormal   Collection Time: 08/21/22 12:50 PM  Result Value Ref Range   Rapid Strep A Screen Positive (A) Negative    Assessment and Plan :  PDMP not reviewed this encounter.  1. Strep pharyngitis   2. Throat pain     Will treat for strep pharyngitis.  Patient is to start amoxicillin, use supportive care otherwise. Counseled patient on potential for adverse effects with medications prescribed/recommended today, ER and return-to-clinic precautions discussed, patient verbalized understanding.    Wallis Bamberg, PA-C 08/21/22 1253

## 2022-09-06 ENCOUNTER — Encounter: Payer: Self-pay | Admitting: Emergency Medicine

## 2022-09-06 ENCOUNTER — Ambulatory Visit
Admission: EM | Admit: 2022-09-06 | Discharge: 2022-09-06 | Disposition: A | Discharge status: Home / Self Care | Payer: Self-pay | Attending: Physician Assistant | Admitting: Physician Assistant

## 2022-09-06 ENCOUNTER — Other Ambulatory Visit: Payer: Self-pay

## 2022-09-06 DIAGNOSIS — R051 Acute cough: Secondary | ICD-10-CM

## 2022-09-06 DIAGNOSIS — J111 Influenza due to unidentified influenza virus with other respiratory manifestations: Secondary | ICD-10-CM

## 2022-09-06 DIAGNOSIS — Z113 Encounter for screening for infections with a predominantly sexual mode of transmission: Secondary | ICD-10-CM

## 2022-09-06 DIAGNOSIS — J069 Acute upper respiratory infection, unspecified: Secondary | ICD-10-CM

## 2022-09-06 DIAGNOSIS — U071 COVID-19: Secondary | ICD-10-CM | POA: Insufficient documentation

## 2022-09-06 MED ORDER — PROMETHAZINE-DM 6.25-15 MG/5ML PO SYRP: 5.0000 mL | ORAL_SOLUTION | Freq: Three times a day (TID) | ORAL | 0 refills | Status: AC | PRN

## 2022-09-06 MED ORDER — OSELTAMIVIR PHOSPHATE 75 MG PO CAPS: 75.0000 mg | ORAL_CAPSULE | Freq: Two times a day (BID) | ORAL | 0 refills | Status: AC

## 2022-09-06 MED ORDER — FLUTICASONE PROPIONATE 50 MCG/ACT NA SUSP: 1.0000 | Freq: Every day | NASAL | 0 refills | Status: AC

## 2022-09-06 NOTE — ED Triage Notes (Signed)
Pt c/o flu like symptoms with body aches, chills and dry cough.

## 2022-09-06 NOTE — ED Provider Notes (Signed)
[facility]    CSN: [id number] Arrival date & time: 09/06/22  1252      History   Chief Complaint Chief Complaint  Patient presents with   Generalized Body Aches    HPI Joanna Todd is a 37 y.o. female.   Patient presents today with a 24-hour history of URI symptoms including body aches, chills, dry cough, congestion, sore throat.  Denies any chest pain, shortness of breath, nausea, vomiting, diarrhea.  Denies any known sick contacts.  She has had flu in the past and reports current symptoms are similar to previous episodes of this condition.  She has not had influenza vaccine this year.  She has had COVID vaccines.  Reports several weeks ago she was treated for strep with amoxicillin but denies additional antibiotics or steroids in the past 90 days.  She has tried over-the-counter medication without improvement of symptoms.  She denies any history of allergies, asthma, COPD.  She is confident that she is not pregnant.  In addition, patient reports that she recently was treated for strep with amoxicillin.  She then developed some vaginal irritation with associated discharge.  She has tried Monistat without improvement of symptoms.  She is sexually active and does not consistently as a condom.  She is interested in STI testing.  Denies any pelvic pain, abdominal pain, fever, nausea, vomiting.    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 Authorizing Provider  fluticasone (FLONASE) 50 MCG/ACT nasal spray Place 1 spray into both nostrils daily. 09/06/22  Yes Laconya Clere, Derry Skill, PA-C  oseltamivir (TAMIFLU) 75 MG capsule Take 1 capsule (75 mg total) by mouth every 12 (twelve) hours. 09/06/22  Yes Severino Paolo K, PA-C  promethazine-dextromethorphan (PROMETHAZINE-DM) 6.25-15 MG/5ML syrup Take 5 mLs by mouth 3 (three) times daily as needed for cough. 09/06/22  Yes China Deitrick, Derry Skill, PA-C  cetirizine (ZYRTEC ALLERGY) 10 MG tablet Take 1 tablet (10 mg total) by mouth daily. 08/21/22   Jaynee Eagles, PA-C  ibuprofen (ADVIL) 600 MG tablet Take 1 tablet (600 mg total) by mouth every 6 (six) hours as needed. 08/21/22   Jaynee Eagles, PA-C  pseudoephedrine (SUDAFED) 60 MG tablet Take 1 tablet (60 mg total) by mouth every 8 (eight) hours as needed for congestion. 08/21/22   Jaynee Eagles, PA-C    Family History Family History  Problem Relation Age of Onset  Hypertension Mother    Anxiety disorder Mother    Anxiety disorder Sister    Anxiety disorder Maternal Aunt    Anxiety disorder Maternal Grandmother     Social History Social History   Tobacco Use   Smoking status: Every Day    Packs/day: 0.50    Types: Cigarettes   Smokeless tobacco: Never  Vaping Use   Vaping Use: Never used  Substance Use Topics   Alcohol use: No   Drug use: No     Allergies   Patient has no known allergies.   Review of Systems Review of Systems  Constitutional:  Positive for activity change, chills and fever. Negative for appetite change and fatigue.  HENT:  Positive for congestion and sore throat. Negative for sinus pressure and sneezing.   Respiratory:  Positive for  cough. Negative for shortness of breath.   Cardiovascular:  Negative for chest pain.  Gastrointestinal:  Negative for abdominal pain, diarrhea, nausea and vomiting.  Genitourinary:  Negative for vaginal bleeding, vaginal discharge and vaginal pain.  Musculoskeletal:  Positive for arthralgias and myalgias.  Neurological:  Positive for headaches. Negative for dizziness and light-headedness.     Physical Exam Triage Vital Signs ED Triage Vitals [09/06/22 1417]  Enc Vitals Group     BP 139/86     Pulse Rate 74     Resp 19     Temp 99.5 F (37.5 C)     Temp Source Oral     SpO2 98 %     Weight 170 lb (77.1 kg)     Height 5\' 4"  (1.626 m)     Head Circumference      Peak Flow      Pain Score 8     Pain Loc      Pain Edu?      Excl. in [place]?    No data found.  Updated Vital Signs BP 139/86 (BP Location: Right Arm)   Pulse 74   Temp 99.5 F (37.5 C) (Oral)   Resp 19   Ht 5\' 4"  (1.626 m)   Wt 170 lb (77.1 kg)   LMP 08/04/2022 (Approximate)   SpO2 98%   BMI 29.18 kg/m   Visual Acuity Right Eye Distance:   Left Eye Distance:   Bilateral Distance:    Right Eye Near:   Left Eye Near:    Bilateral Near:     Physical Exam Vitals reviewed.  Constitutional:      General: She is awake. She is not in acute distress.    Appearance: Normal appearance. She is well-developed. She is not ill-appearing.     Comments: Very pleasant female appears stated age in no acute distress sitting comfortably in exam room  HENT:     Head: Normocephalic and atraumatic.     Right Ear: Tympanic membrane, ear canal and external ear normal. Tympanic membrane is not erythematous or bulging.     Left Ear: Tympanic membrane, ear canal and external ear normal. Tympanic membrane is not erythematous or bulging.     Nose:     Right Sinus: No maxillary sinus tenderness or frontal sinus tenderness.     Left Sinus: No maxillary sinus tenderness or frontal sinus tenderness.     Mouth/Throat:     Pharynx:  Uvula midline. Posterior oropharyngeal erythema present. No oropharyngeal exudate.  Cardiovascular:     Rate and Rhythm: Normal rate and regular rhythm.     Heart sounds: Normal heart sounds, S1 normal and S2  normal. No murmur heard. Pulmonary:     Effort: Pulmonary effort is normal.     Breath sounds: Normal breath sounds. No wheezing, rhonchi or rales.     Comments: Clear to auscultation bilaterally Psychiatric:        Behavior: Behavior is cooperative.      UC Treatments / Results  Labs (all labs ordered are listed, but only abnormal results are displayed) Labs Reviewed  SARS CORONAVIRUS 2 (TAT 6-24 HRS)  CERVICOVAGINAL ANCILLARY ONLY    EKG   Radiology No results found.  Procedures Procedures (including critical care time)  Medications Ordered in UC Medications - No data to display  Initial Impression / Assessment and Plan / UC Course  I have reviewed the triage vital signs and the nursing notes.  Pertinent labs & imaging results that were available during my care of the patient were reviewed by me and considered in my medical decision making (see chart for details).     Patient is well-appearing, afebrile, nontoxic, nontachycardic.  Suspect viral etiology given clinical presentation.  Unfortunately, we do not have any flu testing available.  Will empirically treat with Tamiflu twice daily.  She was tested for COVID and if she is positive we will discontinue Tamiflu.  She is young and otherwise healthy though she does smoke and so is a candidate for antiviral therapy.  Unfortunately we do not have a recent kidney function on file so would need to consider molnupiravir if she is interested in antiviral.  Will treat symptomatically with Flonase, Mucinex, Tylenol, ibuprofen.  She was prescribed Promethazine DM for cough with instruction not to drive or drink alcohol with taking this medication.  She is to rest and drink plenty of fluid.  Discussed that if her symptoms are  not improving within a few days she is to return for reevaluation.  If she has any worsening or changing symptoms including chest pain, shortness of breath, fever not responding to medication, weakness, nausea/vomiting interfere with oral intake needs to be seen immediately.  Strict return precautions given.  Work excuse note provided with current return to work guidelines based on COVID test result.  Self swab was completed during clinic visit today.  We will contact her if we need to arrange any treatment.  Recommend hypoallergenic soaps and detergents wearing loosefitting cotton underwear.  If she develops any symptoms including abdominal pain, the pain, fever, nausea, vomiting she needs to be seen immediately.  Final Clinical Impressions(s) / UC Diagnoses   Final diagnoses:  Upper respiratory tract infection, unspecified type  Acute cough  Influenza-like illness  Routine screening for STI (sexually transmitted infection)     Discharge Instructions      I am concerned that you have the flu.  Please start Tamiflu twice daily for 5 days.  Use Flonase to help with your congestion.  You can use over-the-counter medication such as Mucinex, Tylenol, ibuprofen.  Make sure you are resting and drinking plenty of fluid.  Start Promethazine DM for cough.  This will make you sleepy so do not drive or drink alcohol while taking it.  We will contact you if you are positive for COVID.  Monitor your MyChart for these results.  If your symptoms or not improving within a week please return for reevaluation.  If anything worsens you should be seen immediately.  We will contact you if anything is positive on your swab.  Wear loosefitting cotton underwear and use hypoallergenic soaps and detergents.  If you develop any  additional symptoms including pelvic pain, abdominal pain, fever, nausea, vomiting you should be seen immediately.     ED Prescriptions     Medication Sig Dispense Auth. Provider    oseltamivir (TAMIFLU) 75 MG capsule Take 1 capsule (75 mg total) by mouth every 12 (twelve) hours. 10 capsule Robbert Langlinais K, PA-C   promethazine-dextromethorphan (PROMETHAZINE-DM) 6.25-15 MG/5ML syrup Take 5 mLs by mouth 3 (three) times daily as needed for cough. 118 mL Gared Gillie K, PA-C   fluticasone (FLONASE) 50 MCG/ACT nasal spray Place 1 spray into both nostrils daily. 16 g Haizley Cannella K, PA-C      PDMP not reviewed this encounter.   Terrilee Croak, PA-C 09/06/22 1521

## 2022-09-06 NOTE — Discharge Instructions (Signed)
I am concerned that you have the flu.  Please start Tamiflu twice daily for 5 days.  Use Flonase to help with your congestion.  You can use over-the-counter medication such as Mucinex, Tylenol, ibuprofen.  Make sure you are resting and drinking plenty of fluid.  Start Promethazine DM for cough.  This will make you sleepy so do not drive or drink alcohol while taking it.  We will contact you if you are positive for COVID.  Monitor your MyChart for these results.  If your symptoms or not improving within a week please return for reevaluation.  If anything worsens you should be seen immediately.  We will contact you if anything is positive on your swab.  Wear loosefitting cotton underwear and use hypoallergenic soaps and detergents.  If you develop any additional symptoms including pelvic pain, abdominal pain, fever, nausea, vomiting you should be seen immediately.

## 2022-09-07 LAB — CERVICOVAGINAL ANCILLARY ONLY
Bacterial Vaginitis (gardnerella): POSITIVE — AB
Candida Glabrata: NEGATIVE
Candida Vaginitis: NEGATIVE
Chlamydia: NEGATIVE
Comment: NEGATIVE
Comment: NEGATIVE
Comment: NEGATIVE
Comment: NEGATIVE
Comment: NEGATIVE
Comment: NORMAL
Neisseria Gonorrhea: NEGATIVE
Trichomonas: NEGATIVE

## 2022-09-07 LAB — SARS CORONAVIRUS 2 (TAT 6-24 HRS): SARS Coronavirus 2: POSITIVE — AB

## 2022-09-08 ENCOUNTER — Telehealth (HOSPITAL_COMMUNITY): Payer: Self-pay | Admitting: Emergency Medicine

## 2022-09-08 MED ORDER — METRONIDAZOLE 500 MG PO TABS: 500.0000 mg | ORAL_TABLET | Freq: Two times a day (BID) | ORAL | 0 refills | Status: AC

## 2023-04-16 ENCOUNTER — Ambulatory Visit
Admission: EM | Admit: 2023-04-16 | Discharge: 2023-04-16 | Disposition: A | Discharge status: Home / Self Care | Payer: BLUE CROSS/BLUE SHIELD | Attending: Physician Assistant | Admitting: Physician Assistant

## 2023-04-16 ENCOUNTER — Encounter: Payer: Self-pay | Admitting: Emergency Medicine

## 2023-04-16 DIAGNOSIS — R52 Pain, unspecified: Secondary | ICD-10-CM | POA: Diagnosis present

## 2023-04-16 DIAGNOSIS — Z1152 Encounter for screening for COVID-19: Secondary | ICD-10-CM | POA: Diagnosis not present

## 2023-04-16 DIAGNOSIS — M791 Myalgia, unspecified site: Secondary | ICD-10-CM | POA: Insufficient documentation

## 2023-04-16 DIAGNOSIS — R509 Fever, unspecified: Secondary | ICD-10-CM | POA: Diagnosis present

## 2023-04-16 NOTE — ED Provider Notes (Signed)
[name]    CSN: [id number] Arrival date & time: 04/16/23  1446      History   Chief Complaint Chief Complaint  Patient presents with   Fever   Generalized Body Aches    HPI Joanna Todd is a 37 y.o. female.   Patient here today for evaluation of fever and bodyaches that she has had since last Tuesday.  She reports that symptoms have improved somewhat today.  She states an at home COVID test was negative and she has tried taking Tamiflu but without improvement.  She denies any cough.  She has not had significant congestion or sore throat.  She denies any nausea, vomiting or diarrhea.  She denies any dysuria.  The history is provided by the patient.  Fever Associated symptoms: myalgias   Associated symptoms: no chills, no congestion, no cough, no dysuria, no nausea, no sore throat and no vomiting     Past Medical History:  Diagnosis Date   Anxiety    Depression     Patient Active Problem List   Diagnosis Date Noted   UTI (urinary tract infection) 03/18/2019   Mixed obsessional thoughts and acts 02/13/2019   Mild episode of recurrent major depressive disorder ([facility]) 02/13/2019   Allergic rhinitis 11/20/2014   Acute bronchitis 11/20/2014   Costochondritis 11/20/2014   Adjustment disorder with mixed anxiety and depressed mood 10/01/2014   Status post repeat low transverse cesarean section 05/15/2011    Past Surgical History:  Procedure Laterality Date   CESAREAN SECTION      OB History     Gravida  3   Para  3   Term  3   Preterm  0   AB  0   Living  3      SAB  0   IAB  0   Ectopic  0   Multiple  0   Live Births  1            Home Medications    Prior to Admission medications   Medication Sig Start Date End Date Taking? Authorizing Provider  cetirizine (ZYRTEC ALLERGY) 10 MG tablet Take 1 tablet (10 mg total) by mouth daily. 08/21/22   Wallis Bamberg, PA-C  fluticasone (FLONASE) 50 MCG/ACT nasal spray Place 1 spray  into both nostrils daily. 09/06/22   Raspet, Noberto Retort, PA-C  ibuprofen (ADVIL) 600 MG tablet Take 1 tablet (600 mg total) by mouth every 6 (six) hours as needed. 08/21/22   Wallis Bamberg, PA-C  metroNIDAZOLE (FLAGYL) 500 MG tablet Take 1 tablet (500 mg total) by mouth 2 (two) times daily. 09/08/22   LampteyBritta Mccreedy, MD  oseltamivir (TAMIFLU) 75 MG capsule Take 1 capsule (75 mg total) by mouth every 12 (twelve) hours. 09/06/22   Raspet, Noberto Retort, PA-C  promethazine-dextromethorphan (PROMETHAZINE-DM) 6.25-15 MG/5ML syrup Take 5 mLs by mouth 3 (three) times daily as needed for cough. 09/06/22   Raspet, Noberto Retort, PA-C  pseudoephedrine (SUDAFED) 60 MG tablet Take 1 tablet (60 mg total) by mouth every 8 (eight) hours as needed for congestion. 08/21/22   Wallis Bamberg, PA-C    Family History Family History  Problem Relation Age of Onset   Hypertension Mother    Anxiety disorder Mother    Anxiety disorder Sister    Anxiety disorder Maternal Aunt    Anxiety disorder Maternal Grandmother     Social History Social History   Tobacco Use   Smoking status: Every Day  Current packs/day: 0.50    Types: Cigarettes   Smokeless tobacco: Never  Vaping Use   Vaping status: Never Used  Substance Use Topics   Alcohol use: No   Drug use: No     Allergies   Patient has no known allergies.   Review of Systems Review of Systems  Constitutional:  Positive for fever. Negative for chills.  HENT:  Negative for congestion and sore throat.   Eyes:  Negative for discharge and redness.  Respiratory:  Negative for cough and shortness of breath.   Gastrointestinal:  Negative for abdominal pain, nausea and vomiting.  Genitourinary:  Negative for dysuria.  Musculoskeletal:  Positive for myalgias.     Physical Exam Triage Vital Signs ED Triage Vitals [04/16/23 1454]  Encounter Vitals Group     BP 109/72     Systolic BP Percentile      Diastolic BP Percentile      Pulse Rate 64     Resp 16     Temp 98 F (36.7  C)     Temp Source Oral     SpO2 97 %     Weight      Height      Head Circumference      Peak Flow      Pain Score 0     Pain Loc      Pain Education      Exclude from Growth Chart    No data found.  Updated Vital Signs BP 109/72 (BP Location: Left Arm)   Pulse 64   Temp 98 F (36.7 C) (Oral)   Resp 16   LMP 04/09/2023   SpO2 97%      Physical Exam Vitals and nursing note reviewed.  Constitutional:      General: She is not in acute distress.    Appearance: Normal appearance. She is not ill-appearing.  HENT:     Head: Normocephalic and atraumatic.     Right Ear: Tympanic membrane, ear canal and external ear normal. There is no impacted cerumen.     Left Ear: Tympanic membrane, ear canal and external ear normal. There is no impacted cerumen.     Nose: Nose normal. No congestion or rhinorrhea.     Mouth/Throat:     Mouth: Mucous membranes are moist.     Pharynx: Oropharynx is clear. No oropharyngeal exudate or posterior oropharyngeal erythema.  Eyes:     Conjunctiva/sclera: Conjunctivae normal.  Cardiovascular:     Rate and Rhythm: Normal rate and regular rhythm.     Heart sounds: Normal heart sounds.  Pulmonary:     Effort: Pulmonary effort is normal. No respiratory distress.     Breath sounds: Normal breath sounds. No wheezing, rhonchi or rales.  Neurological:     Mental Status: She is alert.  Psychiatric:        Mood and Affect: Mood normal.        Behavior: Behavior normal.        Thought Content: Thought content normal.      UC Treatments / Results  Labs (all labs ordered are listed, but only abnormal results are displayed) Labs Reviewed  SARS CORONAVIRUS 2 (TAT 6-24 HRS)    EKG   Radiology No results found.  Procedures Procedures (including critical care time)  Medications Ordered in UC Medications - No data to display  Initial Impression / Assessment and Plan / UC Course  I have reviewed the triage vital signs and the nursing  notes.  Pertinent labs & imaging results that were available during my care of the patient were reviewed by me and considered in my medical decision making (see chart for details).   Suspect viral etiology of symptoms and will rescreen for COVID.  Recommend symptomatic treatment, increase fluids and rest.  Patient reports that she is starting to feel better and recommended follow-up if she does not continue to do so or with any worsening or new symptoms.   Final Clinical Impressions(s) / UC Diagnoses   Final diagnoses:  Body aches   Discharge Instructions   None    ED Prescriptions   None    PDMP not reviewed this encounter.   Tomi Bamberger, PA-C 04/16/23 1725

## 2023-04-16 NOTE — ED Triage Notes (Addendum)
Intermittent fever and body aches since last Tuesday. Has been taking ibuprofen to manage symptoms. At-home covid test negative. Had some leftover tamiflu from last year, took three doses, no improvement in symptoms

## 2023-10-29 ENCOUNTER — Ambulatory Visit: Payer: BLUE CROSS/BLUE SHIELD | Admitting: Podiatry
